# Patient Record
Sex: Female | Born: 1970 | Race: Black or African American | Hispanic: No | Marital: Single | Smoking: Current every day smoker
Health system: Southern US, Community
[De-identification: ages and names within clinical notes are randomized; demographics above are authoritative.]

## PROBLEM LIST (undated history)

## (undated) DIAGNOSIS — E079 Disorder of thyroid, unspecified: Secondary | ICD-10-CM

## (undated) DIAGNOSIS — D219 Benign neoplasm of connective and other soft tissue, unspecified: Secondary | ICD-10-CM

## (undated) DIAGNOSIS — D3502 Benign neoplasm of left adrenal gland: Secondary | ICD-10-CM

## (undated) DIAGNOSIS — M1990 Unspecified osteoarthritis, unspecified site: Secondary | ICD-10-CM

## (undated) DIAGNOSIS — K7689 Other specified diseases of liver: Secondary | ICD-10-CM

## (undated) DIAGNOSIS — F419 Anxiety disorder, unspecified: Secondary | ICD-10-CM

## (undated) DIAGNOSIS — D649 Anemia, unspecified: Secondary | ICD-10-CM

## (undated) DIAGNOSIS — R519 Headache, unspecified: Secondary | ICD-10-CM

## (undated) DIAGNOSIS — K219 Gastro-esophageal reflux disease without esophagitis: Secondary | ICD-10-CM

## (undated) DIAGNOSIS — I10 Essential (primary) hypertension: Secondary | ICD-10-CM

## (undated) HISTORY — PX: HERNIA REPAIR: SHX51

## (undated) HISTORY — DX: Other specified diseases of liver: K76.89

## (undated) HISTORY — DX: Unspecified osteoarthritis, unspecified site: M19.90

## (undated) HISTORY — PX: DILATION AND CURETTAGE OF UTERUS: SHX78

## (undated) HISTORY — DX: Benign neoplasm of connective and other soft tissue, unspecified: D21.9

## (undated) HISTORY — DX: Benign neoplasm of left adrenal gland: D35.02

## (undated) HISTORY — PX: TUBAL LIGATION: SHX77

---

## 1998-01-23 ENCOUNTER — Encounter: Admission: RE | Admit: 1998-01-23 | Discharge: 1998-01-23 | Payer: Self-pay | Admitting: Obstetrics

## 1998-01-23 ENCOUNTER — Other Ambulatory Visit: Admission: RE | Admit: 1998-01-23 | Discharge: 1998-01-23 | Payer: Self-pay | Admitting: Obstetrics

## 1999-01-21 ENCOUNTER — Emergency Department (HOSPITAL_COMMUNITY): Admission: EM | Admit: 1999-01-21 | Discharge: 1999-01-21 | Payer: Self-pay | Admitting: Emergency Medicine

## 1999-10-13 ENCOUNTER — Other Ambulatory Visit: Admission: RE | Admit: 1999-10-13 | Discharge: 1999-10-13 | Payer: Self-pay | Admitting: Obstetrics

## 1999-12-06 ENCOUNTER — Inpatient Hospital Stay (HOSPITAL_COMMUNITY): Admission: AD | Admit: 1999-12-06 | Discharge: 1999-12-06 | Payer: Self-pay | Admitting: Obstetrics

## 1999-12-28 ENCOUNTER — Inpatient Hospital Stay (HOSPITAL_COMMUNITY): Admission: AD | Admit: 1999-12-28 | Discharge: 1999-12-28 | Payer: Self-pay | Admitting: Obstetrics

## 2000-05-07 ENCOUNTER — Inpatient Hospital Stay (HOSPITAL_COMMUNITY): Admission: AD | Admit: 2000-05-07 | Discharge: 2000-05-10 | Payer: Self-pay | Admitting: Obstetrics

## 2000-05-07 ENCOUNTER — Inpatient Hospital Stay (HOSPITAL_COMMUNITY): Admission: AD | Admit: 2000-05-07 | Discharge: 2000-05-07 | Payer: Self-pay | Admitting: Obstetrics

## 2001-09-21 ENCOUNTER — Inpatient Hospital Stay (HOSPITAL_COMMUNITY): Admission: AD | Admit: 2001-09-21 | Discharge: 2001-09-21 | Payer: Self-pay | Admitting: Obstetrics

## 2001-11-06 ENCOUNTER — Inpatient Hospital Stay (HOSPITAL_COMMUNITY): Admission: AD | Admit: 2001-11-06 | Discharge: 2001-11-08 | Payer: Self-pay | Admitting: Obstetrics

## 2001-11-06 ENCOUNTER — Encounter (INDEPENDENT_AMBULATORY_CARE_PROVIDER_SITE_OTHER): Payer: Self-pay | Admitting: Specialist

## 2004-07-11 ENCOUNTER — Emergency Department (HOSPITAL_COMMUNITY): Admission: EM | Admit: 2004-07-11 | Discharge: 2004-07-11 | Payer: Self-pay | Admitting: *Deleted

## 2005-02-23 ENCOUNTER — Emergency Department (HOSPITAL_COMMUNITY): Admission: EM | Admit: 2005-02-23 | Discharge: 2005-02-23 | Payer: Self-pay | Admitting: Emergency Medicine

## 2005-02-26 ENCOUNTER — Emergency Department (HOSPITAL_COMMUNITY): Admission: EM | Admit: 2005-02-26 | Discharge: 2005-02-26 | Payer: Self-pay | Admitting: Family Medicine

## 2007-03-07 ENCOUNTER — Emergency Department (HOSPITAL_COMMUNITY): Admission: EM | Admit: 2007-03-07 | Discharge: 2007-03-07 | Payer: Self-pay | Admitting: Emergency Medicine

## 2007-11-30 ENCOUNTER — Emergency Department (HOSPITAL_COMMUNITY): Admission: EM | Admit: 2007-11-30 | Discharge: 2007-11-30 | Payer: Self-pay | Admitting: Family Medicine

## 2008-04-09 ENCOUNTER — Emergency Department (HOSPITAL_COMMUNITY): Admission: EM | Admit: 2008-04-09 | Discharge: 2008-04-09 | Payer: Self-pay | Admitting: Family Medicine

## 2008-08-23 HISTORY — PX: LAPAROSCOPIC CHOLECYSTECTOMY: SUR755

## 2008-10-31 ENCOUNTER — Emergency Department (HOSPITAL_COMMUNITY): Admission: EM | Admit: 2008-10-31 | Discharge: 2008-10-31 | Payer: Self-pay | Admitting: Emergency Medicine

## 2008-11-26 ENCOUNTER — Emergency Department (HOSPITAL_COMMUNITY): Admission: EM | Admit: 2008-11-26 | Discharge: 2008-11-26 | Payer: Self-pay | Admitting: Emergency Medicine

## 2008-12-02 ENCOUNTER — Encounter (INDEPENDENT_AMBULATORY_CARE_PROVIDER_SITE_OTHER): Payer: Self-pay | Admitting: General Surgery

## 2008-12-02 ENCOUNTER — Ambulatory Visit (HOSPITAL_COMMUNITY): Admission: RE | Admit: 2008-12-02 | Discharge: 2008-12-03 | Payer: Self-pay | Admitting: General Surgery

## 2010-07-30 ENCOUNTER — Ambulatory Visit: Payer: Self-pay | Admitting: Nurse Practitioner

## 2010-11-10 ENCOUNTER — Other Ambulatory Visit (HOSPITAL_COMMUNITY): Payer: Self-pay | Admitting: Obstetrics

## 2010-11-10 DIAGNOSIS — R52 Pain, unspecified: Secondary | ICD-10-CM

## 2010-11-10 DIAGNOSIS — Z1231 Encounter for screening mammogram for malignant neoplasm of breast: Secondary | ICD-10-CM

## 2010-11-17 ENCOUNTER — Ambulatory Visit (HOSPITAL_COMMUNITY): Admission: RE | Admit: 2010-11-17 | Payer: Medicaid Other | Source: Ambulatory Visit

## 2010-11-17 ENCOUNTER — Ambulatory Visit (HOSPITAL_COMMUNITY): Payer: Medicaid Other

## 2010-11-18 ENCOUNTER — Ambulatory Visit (HOSPITAL_COMMUNITY): Payer: Medicaid Other | Attending: Obstetrics

## 2010-11-26 ENCOUNTER — Other Ambulatory Visit (HOSPITAL_COMMUNITY): Payer: Self-pay | Admitting: Obstetrics

## 2010-11-26 DIAGNOSIS — N938 Other specified abnormal uterine and vaginal bleeding: Secondary | ICD-10-CM

## 2010-11-30 ENCOUNTER — Ambulatory Visit (HOSPITAL_COMMUNITY): Admission: RE | Admit: 2010-11-30 | Payer: Medicaid Other | Source: Ambulatory Visit

## 2010-12-02 LAB — DIFFERENTIAL
Basophils Absolute: 0.1 10*3/uL (ref 0.0–0.1)
Basophils Relative: 1 % (ref 0–1)
Eosinophils Absolute: 0.3 10*3/uL (ref 0.0–0.7)
Eosinophils Relative: 5 % (ref 0–5)
Lymphocytes Relative: 36 % (ref 12–46)
Lymphs Abs: 2 10*3/uL (ref 0.7–4.0)
Monocytes Absolute: 0.4 10*3/uL (ref 0.1–1.0)
Monocytes Relative: 7 % (ref 3–12)
Neutro Abs: 2.8 10*3/uL (ref 1.7–7.7)
Neutrophils Relative %: 50 % (ref 43–77)

## 2010-12-02 LAB — COMPREHENSIVE METABOLIC PANEL
ALT: 13 U/L (ref 0–35)
AST: 19 U/L (ref 0–37)
Albumin: 3.6 g/dL (ref 3.5–5.2)
Alkaline Phosphatase: 51 U/L (ref 39–117)
BUN: 9 mg/dL (ref 6–23)
CO2: 25 mEq/L (ref 19–32)
Calcium: 8.8 mg/dL (ref 8.4–10.5)
Chloride: 107 mEq/L (ref 96–112)
Creatinine, Ser: 0.72 mg/dL (ref 0.4–1.2)
GFR calc Af Amer: >60 mL/min (ref >60)
GFR calc non Af Amer: >60 mL/min (ref >60)
Glucose, Bld: 84 mg/dL (ref 70–99)
Potassium: 4 mEq/L (ref 3.5–5.1)
Sodium: 135 mEq/L (ref 135–145)
Total Bilirubin: 0.5 mg/dL (ref 0.3–1.2)
Total Protein: 6.7 g/dL (ref 6.0–8.3)

## 2010-12-02 LAB — PREGNANCY, URINE: Preg Test, Ur: NEGATIVE

## 2010-12-02 LAB — CBC
HCT: 32.8 % — ABNORMAL LOW (ref 36.0–46.0)
Hemoglobin: 11 g/dL — ABNORMAL LOW (ref 12.0–15.0)
MCHC: 33.6 g/dL (ref 30.0–36.0)
MCV: 87.2 fL (ref 78.0–100.0)
Platelets: 270 10*3/uL (ref 150–400)
RBC: 3.76 MIL/uL — ABNORMAL LOW (ref 3.87–5.11)
RDW: 14.6 % (ref 11.5–15.5)
WBC: 5.6 10*3/uL (ref 4.0–10.5)

## 2010-12-02 LAB — LIPASE, BLOOD: Lipase: 38 U/L (ref 11–59)

## 2010-12-03 ENCOUNTER — Ambulatory Visit (HOSPITAL_COMMUNITY)
Admission: RE | Admit: 2010-12-03 | Discharge: 2010-12-03 | Disposition: A | Discharge status: Home / Self Care | Payer: Medicaid Other | Source: Ambulatory Visit | Attending: Obstetrics | Admitting: Obstetrics

## 2010-12-03 DIAGNOSIS — N838 Other noninflammatory disorders of ovary, fallopian tube and broad ligament: Secondary | ICD-10-CM | POA: Insufficient documentation

## 2010-12-03 DIAGNOSIS — D259 Leiomyoma of uterus, unspecified: Secondary | ICD-10-CM | POA: Insufficient documentation

## 2010-12-03 DIAGNOSIS — N949 Unspecified condition associated with female genital organs and menstrual cycle: Secondary | ICD-10-CM | POA: Insufficient documentation

## 2010-12-03 DIAGNOSIS — N938 Other specified abnormal uterine and vaginal bleeding: Secondary | ICD-10-CM | POA: Insufficient documentation

## 2011-01-05 NOTE — Op Note (Signed)
Shari Smith, Shari Smith              ACCOUNT NO.:  [id number]   MEDICAL RECORD NO.:  [id number]          PATIENT TYPE:  OIB   LOCATION:  1528                         FACILITY:  [facility]   PHYSICIAN:  Anselm Pancoast. Weatherly, M.D.DATE OF BIRTH:  [birth year]   DATE OF PROCEDURE:  12/02/2008  DATE OF DISCHARGE:                               OPERATIVE REPORT   PREOPERATIVE DIAGNOSIS:  Chronic cholecystitis with stones.   POSTOPERATIVE DIAGNOSES:  Chronic cholecystitis with stones, small  umbilical hernia.   OPERATION:  Laparoscopic removal of gallbladder with cholangiogram and  repair of small umbilical hernia.   HISTORY:  Shari Smith is a 40 year old female who presented to the  emergency room I think it was last Thursday.  I was on-call as doctor of  the week and I was asked to see her.  When I arrived in the emergency  room, she said her symptoms had subsided approximately an hour half  earlier.  She had had an ultrasound that showed multiple stones within  her gallbladder and a very large gallbladder.  The patient has children.  No arrangements had been made for childcare and said she was basically  painfree at the tim, I offered her that we could add her to the OR  schedule for Monday here at [facility] and she desired to proceed with  that.  We did not repeat her laboratory studies today but used the one  from last week.  Preoperatively she is not in any significant pain at  this time.  She does have a very protuberant abdomen and the patient on  physical exam of the umbilicus you could feel a little about quarter  size defect at the fascia right at the umbilicus.  She has had a ring in  below the umbilicus and I elected to make a little transverse incision  just above the umbilicus.  The little defect was dissected down until  the actual hernia sac and I opened into this and I put two Surgilon  sutures for traction and placed Hasson cannula through the little  fascial defect.  The  patient has a very large gallbladder.  It is not  acutely inflamed and a very floppy liver.  I made the upper 10 mL trocar  in the subxiphoid area and under direct vision entered the peritoneal  cavity and then Dr. Michaell Cowing, who assisted, placed 2 lateral 5 mm trocars  fairly high so we could retract the liver upwards as she has a fairly  large intraperitoneal cavity.  The gallbladder was retracted upward and  outward.  The proximal portion of the gallbladder could be reflected  upward and outward and then you could see the peritoneum over the  proximal portion of the gallbladder and the cystic duct artery.  I  carefully opened the peritoneum and then could encompass the cystic duct  with a right-angle.  She had such a large gallbladder we were not  positive that the area that we were visualizing was definitely a cystic  duct and we were very cautious.  With the junction of the cystic duct  gallbladder definitely  being identified and I could identify the little  cystic duct lymph node, we then placed a clip across the junction of the  cystic duct with the gallbladder and then made a little opening in the  duct just proximal and a Cook catheter was introduced and held in place  with a clip.  The cholangiogram was then obtained and it shows that we  are going to about a 2 possibly a 3 cm cystic duct.  You can see the  intrahepatic and the extrahepatic bile duct and we then removed the  cholangiogram, triply clipped the cystic duct just proximal to where we  had made a little opening and then divided it.  I then dissected down  the cystic duct retracting the gallbladder laterally and could visualize  the cystic duct artery was doubly clipped proximally, singly distally  and divided and then the posterior branch of the cystic duct artery  likewise was visualized and this was doubly clipped proximally and then  divided.  We retracted the gallbladder upward and outward and it is kind  on a  mesentery and good hemostasis was obtained as we freed the  gallbladder from its liver bed.  We then placed the gallbladder in the  EndoCatch bag and then switched the camera to the upper 10 mm trocar and  grasped the bag with the mother-in-law grasper, brought it up to the  Hasson cannula and then withdrew the Hasson cannula.  I tried to pull  the gallbladder up into the actual wound but the stones within it are so  large that I could not get the stones out through the fascia defect and  I opened the fascia just slightly on the left side the transverse  incision and then we could pull the gallbladder up and then opened the  gallbladder within the bag and then using the sponge stick could  retrieve the stones so that after about 6 or 8 stones that were up to  about 2 cm in size were removed.  Then I could bring the bag containing  the gallbladder out of the peritoneal cavity.  I put a 0 Prolene suture  on the left side and then the little stitch that we had placed and upon  putting the traction suture initially was withdrawn and a good bite of  the fascia.  The CO2 was not on since it was fallen out of the little  fascial defect at this time and I put a second suture, doing a figure-of-  eight and then we reinflated the peritoneal cavity.  We could see where  we had caught the peritoneal surface of the transverse colon in this  suture.  I immediately cut the stitch and then on looking at the little  area, we could not see any type of bowel contents or anything but the  Prolene suture had definitely kind of gone through the tenia and then we  elected to put 2 figure-of-eight or Lembert sutures.  One was a figure-  of-eight, one was a simple Lembert and Dr. Michaell Cowing did this  laparoscopically since in order pull the colon up through the little  fascia incision it would have been necessary to make the incision a  little bigger.  These were both done under direct vision  laparoscopically.  No  evidence of any staining of the tissue, etc.  whether or not there were actually a little hole in the mucosa or not,  we could not tell but we think  it would be safer to put the 2  laparoscopic placed 3-0 Vicryl sutures.  Next the fascia at the  umbilicus was closed with about 3 figure-of-eight sutures of 0 Prolene  done with watching the colon with the air inflated so that there was no  evidence of catching the bowel any more and then we looked very  carefully in the little areas where the suture had been placed and there  was good closure and no evidence of any staining, the irrigating fluid  was removed.  We looked the liver bed and it was good hemostasis and  then the 5 mm port withdrawn under direct vision and the upper 10 mL  trocar was withdrawn last.  We had switched to a 5 mm scope 30 degrees  since the laparoscopic suture initially was used a stitch laterally in  through umbilical or the subxiphoid area.  This camera location gave the  easiest visualization on doing the 2 laparoscopic place stitches in  theperiteum over the tinea of the colon.  The subcutaneous wounds were  closed with a 4-0 Vicryl.  Benzoin and Steri-Strips were placed on the  skin.  We put Marcaine in the skin incisions, on the fascia incisions  and will we let the patient decide whether she wants to be released this  afternoon or spend the night and be released in the morning.      Anselm Pancoast. Zachery Dakins, M.D.  Electronically Signed     WJW/MEDQ  D:  12/02/2008  T:  12/03/2008  Job:  [id number]   cc:   [facility]

## 2011-01-08 NOTE — Discharge Summary (Signed)
[facility]  Patient:    Shari Smith, Shari Smith Visit Number: [id number] MRN: [id number]          Service Type: OBS Location: 910A 9131 01 Attending Physician:  Venita Sheffield Dictated by:   Kathreen Cosier, M.D. Admit Date:  11/06/2001 Discharge Date: 11/08/2001                             Discharge Summary  HISTORY OF PRESENT ILLNESS:   The patient is a 40 year old gravida 4, para 3/3, [facility] March 13, who is in for elective induction.  She was contracting irregularly with cervix 3-4 cm and 60%, vertex -3.  HOSPITAL COURSE:              She received Pitocin stimulation and had a normal vaginal delivery of a female, Apgars 8 and 9.  Postop she did well and desires sterilization.  On March 18 she underwent postpartum tubal ligation. She did well and was discharged home on the second postpartum day, ambulatory on a regular diet, on Tylenol No. 3 one q.4h. p.r.n. for pain.  DISCHARGE DIAGNOSIS:          Status post elective induction at term and                               postpartum tubal ligation. Dictated by:   Kathreen Cosier, M.D. Attending Physician:  Venita Sheffield DD:  11/08/01 TD:  11/09/01 Job: [id number]

## 2011-01-08 NOTE — Op Note (Signed)
[facility]  Patient:    Shari Smith, Shari Smith Visit Number: [id number] MRN: [id number]          Service Type: Attending:  Kathreen Cosier, M.D. Dictated by:   Kathreen Cosier, M.D. Proc. Date: 11/06/01                             Operative Report  PREOPERATIVE DIAGNOSES:       1. Multiparity.                               2. Proceed to postpartum tubal ligation.  ANESTHESIA:                   General.  DESCRIPTION OF PROCEDURE:     The patient placed on the operating table in a supine position, general anesthesia administered, abdomen prepped and draped, got in with a straight catheter.  Midline subumbilical incision, one inch log, was made and carried down to the fascia.  Fascia was clean and grasped with two Kochers, and the fascia and the peritoneum opened with the Mayo scissors. The left tube was grasped in the mid portion by a Babcock clamp and the tube traced to the fimbria.  A 0 plain suture placed in the mesosalpinx below the portion of tube and then was clamped.  This was tied and approximately one inch of tube transected.  Hemostasis satisfactory.  Procedure done in exact fashion on the other side.  Lap and sponge counts correct.  Abdomen closed in layers.  Peritoneum and fascia continence to avoid ______ .  Skin closed with subcuticular of 3-0 plain.  The patient tolerated the procedure well and taken to the recovery room in good condition. Dictated by:   Kathreen Cosier, M.D. Attending:  Kathreen Cosier, M.D. DD:  11/07/01 TD:  11/07/01 Job: [id number]

## 2011-01-15 ENCOUNTER — Other Ambulatory Visit: Payer: Self-pay | Admitting: Gastroenterology

## 2011-01-21 ENCOUNTER — Ambulatory Visit: Admission: RE | Admit: 2011-01-21 | Payer: Medicaid Other | Source: Ambulatory Visit

## 2011-01-28 ENCOUNTER — Other Ambulatory Visit: Payer: Medicaid Other

## 2011-01-28 ENCOUNTER — Ambulatory Visit
Admission: RE | Admit: 2011-01-28 | Discharge: 2011-01-28 | Disposition: A | Discharge status: Home / Self Care | Payer: Medicaid Other | Source: Ambulatory Visit | Attending: Gastroenterology | Admitting: Gastroenterology

## 2011-01-28 MED ORDER — IOHEXOL 300 MG/ML  SOLN
125.0000 mL | Freq: Once | INTRAMUSCULAR | Status: AC | PRN
  Administered 2011-01-28: 125 mL via INTRAVENOUS

## 2011-02-04 ENCOUNTER — Other Ambulatory Visit: Payer: Self-pay | Admitting: Gastroenterology

## 2011-02-04 DIAGNOSIS — R1084 Generalized abdominal pain: Secondary | ICD-10-CM

## 2011-02-04 DIAGNOSIS — R933 Abnormal findings on diagnostic imaging of other parts of digestive tract: Secondary | ICD-10-CM

## 2011-02-12 ENCOUNTER — Other Ambulatory Visit: Payer: Medicaid Other

## 2011-02-17 ENCOUNTER — Inpatient Hospital Stay: Admission: RE | Admit: 2011-02-17 | Payer: Medicaid Other | Source: Ambulatory Visit

## 2011-05-13 ENCOUNTER — Other Ambulatory Visit (HOSPITAL_COMMUNITY): Payer: Self-pay | Admitting: Obstetrics

## 2011-05-13 DIAGNOSIS — R935 Abnormal findings on diagnostic imaging of other abdominal regions, including retroperitoneum: Secondary | ICD-10-CM

## 2011-05-18 LAB — POCT PREGNANCY, URINE
Operator id: 247071
Preg Test, Ur: NEGATIVE

## 2011-05-18 LAB — POCT H PYLORI SCREEN: H. PYLORI SCREEN, POC: NEGATIVE

## 2011-05-21 ENCOUNTER — Ambulatory Visit (HOSPITAL_COMMUNITY)
Admission: RE | Admit: 2011-05-21 | Discharge: 2011-05-21 | Disposition: A | Discharge status: Home / Self Care | Payer: Medicaid Other | Source: Ambulatory Visit | Attending: Obstetrics | Admitting: Obstetrics

## 2011-05-21 DIAGNOSIS — R109 Unspecified abdominal pain: Secondary | ICD-10-CM | POA: Insufficient documentation

## 2011-05-21 DIAGNOSIS — R935 Abnormal findings on diagnostic imaging of other abdominal regions, including retroperitoneum: Secondary | ICD-10-CM

## 2011-05-21 DIAGNOSIS — D3500 Benign neoplasm of unspecified adrenal gland: Secondary | ICD-10-CM | POA: Insufficient documentation

## 2011-05-21 DIAGNOSIS — K7689 Other specified diseases of liver: Secondary | ICD-10-CM | POA: Insufficient documentation

## 2011-05-21 MED ORDER — GADOBENATE DIMEGLUMINE 529 MG/ML IV SOLN
20.0000 mL | Freq: Once | INTRAVENOUS | Status: AC | PRN
  Administered 2011-05-21: 20 mL via INTRAVENOUS

## 2011-06-07 LAB — POCT URINALYSIS DIP (DEVICE)
Glucose, UA: NEGATIVE
Nitrite: NEGATIVE
Operator id: 235561
Protein, ur: 30 — AB
Specific Gravity, Urine: 1.025
Urobilinogen, UA: 2 — ABNORMAL HIGH
pH: 6

## 2011-12-16 ENCOUNTER — Ambulatory Visit: Payer: Medicaid Other | Admitting: Family Medicine

## 2012-06-25 ENCOUNTER — Encounter (HOSPITAL_COMMUNITY): Payer: Self-pay | Admitting: *Deleted

## 2012-06-25 ENCOUNTER — Emergency Department (HOSPITAL_COMMUNITY)
Admission: EM | Admit: 2012-06-25 | Discharge: 2012-06-25 | Disposition: A | Discharge status: Home / Self Care | Payer: Medicaid Other | Source: Home / Self Care | Attending: Emergency Medicine | Admitting: Emergency Medicine

## 2012-06-25 ENCOUNTER — Emergency Department (INDEPENDENT_AMBULATORY_CARE_PROVIDER_SITE_OTHER): Payer: Medicaid Other

## 2012-06-25 DIAGNOSIS — IMO0002 Reserved for concepts with insufficient information to code with codable children: Secondary | ICD-10-CM

## 2012-06-25 DIAGNOSIS — S8000XA Contusion of unspecified knee, initial encounter: Secondary | ICD-10-CM

## 2012-06-25 DIAGNOSIS — T148XXA Other injury of unspecified body region, initial encounter: Secondary | ICD-10-CM

## 2012-06-25 DIAGNOSIS — S5010XA Contusion of unspecified forearm, initial encounter: Secondary | ICD-10-CM

## 2012-06-25 DIAGNOSIS — S8010XA Contusion of unspecified lower leg, initial encounter: Secondary | ICD-10-CM

## 2012-06-25 DIAGNOSIS — S52123A Displaced fracture of head of unspecified radius, initial encounter for closed fracture: Secondary | ICD-10-CM

## 2012-06-25 MED ORDER — HYDROCODONE-ACETAMINOPHEN 5-500 MG PO TABS: 1.0000 | ORAL_TABLET | Freq: Four times a day (QID) | ORAL | Status: DC | PRN

## 2012-06-25 MED ORDER — IBUPROFEN 800 MG PO TABS: 800.0000 mg | ORAL_TABLET | Freq: Three times a day (TID) | ORAL | Status: DC | PRN
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
## 2012-06-25 NOTE — ED Notes (Signed)
Ortho  Tech  On the  [employer]

## 2012-06-25 NOTE — ED Notes (Signed)
Post  Splint  Applied  By  Ortho  tech
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
## 2012-12-06 ENCOUNTER — Ambulatory Visit: Payer: Self-pay | Admitting: Nurse Practitioner

## 2013-06-10 ENCOUNTER — Emergency Department (HOSPITAL_COMMUNITY): Payer: Medicaid Other

## 2013-06-10 ENCOUNTER — Emergency Department (HOSPITAL_COMMUNITY)
Admission: EM | Admit: 2013-06-10 | Discharge: 2013-06-10 | Disposition: A | Discharge status: Home / Self Care | Payer: Medicaid Other | Attending: Emergency Medicine | Admitting: Emergency Medicine

## 2013-06-10 ENCOUNTER — Encounter (HOSPITAL_COMMUNITY): Payer: Self-pay | Admitting: Emergency Medicine

## 2013-06-10 DIAGNOSIS — Z862 Personal history of diseases of the blood and blood-forming organs and certain disorders involving the immune mechanism: Secondary | ICD-10-CM | POA: Insufficient documentation

## 2013-06-10 DIAGNOSIS — R160 Hepatomegaly, not elsewhere classified: Secondary | ICD-10-CM

## 2013-06-10 DIAGNOSIS — F17200 Nicotine dependence, unspecified, uncomplicated: Secondary | ICD-10-CM | POA: Insufficient documentation

## 2013-06-10 DIAGNOSIS — Z9089 Acquired absence of other organs: Secondary | ICD-10-CM | POA: Insufficient documentation

## 2013-06-10 DIAGNOSIS — Z3202 Encounter for pregnancy test, result negative: Secondary | ICD-10-CM | POA: Insufficient documentation

## 2013-06-10 DIAGNOSIS — Z8639 Personal history of other endocrine, nutritional and metabolic disease: Secondary | ICD-10-CM | POA: Insufficient documentation

## 2013-06-10 DIAGNOSIS — R142 Eructation: Secondary | ICD-10-CM | POA: Insufficient documentation

## 2013-06-10 DIAGNOSIS — K7689 Other specified diseases of liver: Secondary | ICD-10-CM | POA: Insufficient documentation

## 2013-06-10 DIAGNOSIS — R141 Gas pain: Secondary | ICD-10-CM | POA: Insufficient documentation

## 2013-06-10 HISTORY — DX: Disorder of thyroid, unspecified: E07.9

## 2013-06-10 LAB — COMPREHENSIVE METABOLIC PANEL
ALT: 15 U/L (ref 0–35)
AST: 18 U/L (ref 0–37)
Albumin: 3.8 g/dL (ref 3.5–5.2)
Alkaline Phosphatase: 66 U/L (ref 39–117)
BUN: 12 mg/dL (ref 6–23)
CO2: 24 mEq/L (ref 19–32)
Calcium: 8.9 mg/dL (ref 8.4–10.5)
Chloride: 102 mEq/L (ref 96–112)
Creatinine, Ser: 0.74 mg/dL (ref 0.50–1.10)
GFR calc Af Amer: >90 mL/min (ref >90)
GFR calc non Af Amer: >90 mL/min (ref >90)
Glucose, Bld: 85 mg/dL (ref 70–99)
Potassium: 3.4 mEq/L — ABNORMAL LOW (ref 3.5–5.1)
Sodium: 136 mEq/L (ref 135–145)
Total Bilirubin: 0.3 mg/dL (ref 0.3–1.2)
Total Protein: 7.8 g/dL (ref 6.0–8.3)

## 2013-06-10 LAB — CBC WITH DIFFERENTIAL/PLATELET
Basophils Absolute: 0 10*3/uL (ref 0.0–0.1)
Basophils Relative: 1 % (ref 0–1)
Eosinophils Absolute: 0.3 10*3/uL (ref 0.0–0.7)
Eosinophils Relative: 4 % (ref 0–5)
HCT: 33 % — ABNORMAL LOW (ref 36.0–46.0)
Hemoglobin: 11 g/dL — ABNORMAL LOW (ref 12.0–15.0)
Lymphocytes Relative: 44 % (ref 12–46)
Lymphs Abs: 2.7 10*3/uL (ref 0.7–4.0)
MCH: 30.6 pg (ref 26.0–34.0)
MCHC: 33.3 g/dL (ref 30.0–36.0)
MCV: 91.7 fL (ref 78.0–100.0)
Monocytes Absolute: 0.5 10*3/uL (ref 0.1–1.0)
Monocytes Relative: 8 % (ref 3–12)
Neutro Abs: 2.8 10*3/uL (ref 1.7–7.7)
Neutrophils Relative %: 44 % (ref 43–77)
Platelets: 337 10*3/uL (ref 150–400)
RBC: 3.6 MIL/uL — ABNORMAL LOW (ref 3.87–5.11)
RDW: 15.2 % (ref 11.5–15.5)
WBC: 6.3 10*3/uL (ref 4.0–10.5)

## 2013-06-10 LAB — URINE MICROSCOPIC-ADD ON

## 2013-06-10 LAB — URINALYSIS, ROUTINE W REFLEX MICROSCOPIC
Bilirubin Urine: NEGATIVE
Glucose, UA: NEGATIVE mg/dL
Ketones, ur: NEGATIVE mg/dL
Nitrite: NEGATIVE
Protein, ur: NEGATIVE mg/dL
Specific Gravity, Urine: 1.024 (ref 1.005–1.030)
Urobilinogen, UA: 1 mg/dL (ref 0.0–1.0)
pH: 5 (ref 5.0–8.0)

## 2013-06-10 LAB — PREGNANCY, URINE: Preg Test, Ur: NEGATIVE

## 2013-06-10 LAB — LIPASE, BLOOD: Lipase: 36 U/L (ref 11–59)

## 2013-06-10 MED ORDER — IOHEXOL 300 MG/ML  SOLN
100.0000 mL | Freq: Once | INTRAMUSCULAR | Status: AC | PRN
  Administered 2013-06-10: 100 mL via INTRAVENOUS

## 2013-06-10 MED ORDER — OXYCODONE-ACETAMINOPHEN 5-325 MG PO TABS
2.0000 | ORAL_TABLET | Freq: Once | ORAL | Status: AC
  Administered 2013-06-10: 2 via ORAL
  Filled 2013-06-10: qty 2

## 2013-06-10 MED ORDER — IOHEXOL 300 MG/ML  SOLN: 25.0000 mL | INTRAMUSCULAR | Status: DC | PRN

## 2013-06-10 MED ORDER — TRAMADOL HCL 50 MG PO TABS: 50.0000 mg | ORAL_TABLET | Freq: Four times a day (QID) | ORAL | Status: DC | PRN

## 2013-06-10 NOTE — ED Provider Notes (Signed)
CSN: [id number]     Arrival date & time 06/10/13  1508 History   First MD Initiated Contact with Patient 06/10/13 1533     Chief Complaint  Patient presents with  . Abdominal Pain   (Consider location/radiation/quality/duration/timing/severity/associated sxs/prior Treatment) HPI Comments: Patient presents with abdominal pain. She describes a 2 to three-month history of some intermittent pains in her abdomen. She describes as a crampy pain all over her abdomen. She states the pain started getting worse over last few days. It's not radiating. She's having normal bowel movements with no constipation. She denies any nausea vomiting or diarrhea. She denies any bloody stools or melena. She is status post cholecystectomy. She denies any urinary symptoms. She denies any past abdominal surgeries other than the cholecystectomy.  Patient is a 42 y.o. female presenting with abdominal pain.  Abdominal Pain Associated symptoms: no chest pain, no chills, no cough, no diarrhea, no fatigue, no fever, no hematuria, no nausea, no shortness of breath and no vomiting     Past Medical History  Diagnosis Date  . Thyroid disease    Past Surgical History  Procedure Laterality Date  . Cholecystectomy     History reviewed. No pertinent family history. History  Substance Use Topics  . Smoking status: Current Every Day Smoker  . Smokeless tobacco: Not on file  . Alcohol Use: Yes   OB History   Grav Para Term Preterm Abortions TAB SAB Ect Mult Living                 Review of Systems  Constitutional: Negative for fever, chills, diaphoresis and fatigue.  HENT: Negative for congestion, rhinorrhea and sneezing.   Eyes: Negative.   Respiratory: Negative for cough, chest tightness and shortness of breath.   Cardiovascular: Negative for chest pain and leg swelling.  Gastrointestinal: Positive for abdominal pain and abdominal distention. Negative for nausea, vomiting, diarrhea and blood in stool.   Genitourinary: Negative for frequency, hematuria, flank pain and difficulty urinating.  Musculoskeletal: Negative for arthralgias and back pain.  Skin: Negative for rash.  Neurological: Negative for dizziness, speech difficulty, weakness, numbness and headaches.    Allergies  Review of patient's allergies indicates no known allergies.  Home Medications   Current Outpatient Rx  Name  Route  Sig  Dispense  Refill  . acetaminophen (TYLENOL) 500 MG tablet   Oral   Take 1,000 mg by mouth every 8 (eight) hours as needed for pain.         . hydrocortisone cream 1 %   Topical   Apply 1 application topically 2 (two) times daily as needed (for eczemaa).         Marland Kitchen ibuprofen (ADVIL,MOTRIN) 200 MG tablet   Oral   Take 600 mg by mouth every 8 (eight) hours as needed for pain.         . traMADol (ULTRAM) 50 MG tablet   Oral   Take 1 tablet (50 mg total) by mouth every 6 (six) hours as needed for pain.   15 tablet   0    BP 162/91  Pulse 62  Temp(Src) 97.4 F (36.3 C) (Oral)  Resp 20  SpO2 100%  LMP 05/22/2013 Physical Exam  Constitutional: She is oriented to person, place, and time. She appears well-developed and well-nourished.  HENT:  Head: Normocephalic and atraumatic.  Eyes: Pupils are equal, round, and reactive to light.  Neck: Normal range of motion. Neck supple.  Cardiovascular: Normal rate, regular rhythm and normal heart  sounds.   Pulmonary/Chest: Effort normal and breath sounds normal. No respiratory distress. She has no wheezes. She has no rales. She exhibits no tenderness.  Abdominal: Soft. Bowel sounds are normal. There is tenderness (mild diffuse tenderness.). There is no rebound and no guarding.  No fluid wave or tympany to percussion  Musculoskeletal: Normal range of motion. She exhibits no edema.  Lymphadenopathy:    She has no cervical adenopathy.  Neurological: She is alert and oriented to person, place, and time.  Skin: Skin is warm and dry. No rash  noted.  Psychiatric: She has a normal mood and affect.    ED Course  Procedures (including critical care time) Labs Review Results for orders placed during the hospital encounter of 06/10/13  LIPASE, BLOOD      Result Value Range   Lipase 36  11 - 59 U/L  COMPREHENSIVE METABOLIC PANEL      Result Value Range   Sodium 136  135 - 145 mEq/L   Potassium 3.4 (*) 3.5 - 5.1 mEq/L   Chloride 102  96 - 112 mEq/L   CO2 24  19 - 32 mEq/L   Glucose, Bld 85  70 - 99 mg/dL   BUN 12  6 - 23 mg/dL   Creatinine, Ser 4.09  0.50 - 1.10 mg/dL   Calcium 8.9  8.4 - 81.1 mg/dL   Total Protein 7.8  6.0 - 8.3 g/dL   Albumin 3.8  3.5 - 5.2 g/dL   AST 18  0 - 37 U/L   ALT 15  0 - 35 U/L   Alkaline Phosphatase 66  39 - 117 U/L   Total Bilirubin 0.3  0.3 - 1.2 mg/dL   GFR calc non Af Amer >90  >90 mL/min   GFR calc Af Amer >90  >90 mL/min  CBC WITH DIFFERENTIAL      Result Value Range   WBC 6.3  4.0 - 10.5 K/uL   RBC 3.60 (*) 3.87 - 5.11 MIL/uL   Hemoglobin 11.0 (*) 12.0 - 15.0 g/dL   HCT 91.4 (*) 78.2 - 95.6 %   MCV 91.7  78.0 - 100.0 fL   MCH 30.6  26.0 - 34.0 pg   MCHC 33.3  30.0 - 36.0 g/dL   RDW 21.3  08.6 - 57.8 %   Platelets 337  150 - 400 K/uL   Neutrophils Relative % 44  43 - 77 %   Neutro Abs 2.8  1.7 - 7.7 K/uL   Lymphocytes Relative 44  12 - 46 %   Lymphs Abs 2.7  0.7 - 4.0 K/uL   Monocytes Relative 8  3 - 12 %   Monocytes Absolute 0.5  0.1 - 1.0 K/uL   Eosinophils Relative 4  0 - 5 %   Eosinophils Absolute 0.3  0.0 - 0.7 K/uL   Basophils Relative 1  0 - 1 %   Basophils Absolute 0.0  0.0 - 0.1 K/uL  URINALYSIS, ROUTINE W REFLEX MICROSCOPIC      Result Value Range   Color, Urine YELLOW  YELLOW   APPearance CLOUDY (*) CLEAR   Specific Gravity, Urine 1.024  1.005 - 1.030   pH 5.0  5.0 - 8.0   Glucose, UA NEGATIVE  NEGATIVE mg/dL   Hgb urine dipstick MODERATE (*) NEGATIVE   Bilirubin Urine NEGATIVE  NEGATIVE   Ketones, ur NEGATIVE  NEGATIVE mg/dL   Protein, ur NEGATIVE   NEGATIVE mg/dL   Urobilinogen, UA 1.0  0.0 - 1.0  mg/dL   Nitrite NEGATIVE  NEGATIVE   Leukocytes, UA SMALL (*) NEGATIVE  PREGNANCY, URINE      Result Value Range   Preg Test, Ur NEGATIVE  NEGATIVE  URINE MICROSCOPIC-ADD ON      Result Value Range   Squamous Epithelial / LPF MANY (*) RARE   WBC, UA 7-10  <3 WBC/hpf   RBC / HPF 3-6  <3 RBC/hpf   Bacteria, UA FEW (*) RARE   No results found.   Imaging Review Ct Abdomen Pelvis W Contrast  06/10/2013   CLINICAL DATA:  Abdominal pain  EXAM: CT ABDOMEN AND PELVIS WITH CONTRAST  TECHNIQUE: Multidetector CT imaging of the abdomen and pelvis was performed using the standard protocol following bolus administration of intravenous contrast.  CONTRAST:  OMNIPAQUE IOHEXOL 300 MG/ML  SOLN  COMPARISON:  01/28/2011  FINDINGS: Stable 1.8 cm enhancing lesion in the right lobe of the liver towards the dome. The avidly enhancing lesion adjacent to the falciform ligament in the medial segment of the left lobe measures 2.1 cm and has significantly enlarged. A lesion in the lateral segment of the left lobe on image 11 measures 2.6 cm and has also enlarged.  Post cholecystectomy.  The kidneys, spleen, pancreas, right adrenal gland are within normal limits. Stable benign left adrenal adenoma.  The uterus is lobulated likely due to fibroids. Cystic changes are present in both ovaries.  Trace free fluid in the pelvis.  Normal appendix.  Ventral abdominal hernia contains only adipose tissue.  No vertebral compression deformity. No destructive bone lesion.  IMPRESSION: Enhancing lesions in the liver have enlarged as described. Malignancy is not excluded.  Postoperative changes.  Uterine fibroids.   Electronically Signed   By: Maryclare Bean M.D.   On: 06/10/2013 18:40    EKG Interpretation   None       MDM   1. Liver mass    Patient has some enlarging liver lesions filling outpatient followup. She currently doesn't have any significant tenderness on exam of the  abdomen. She's otherwise stable. I encouraged her followup with her primary care physician to arrange for outpatient followup regarding enlarging liver lesions.    Rolan Bucco, MD 06/10/13 2007

## 2013-06-10 NOTE — ED Notes (Signed)
Presents with abdominal distention, abdominal pain and back. Pain began several months ago. Denies nausea, endorses diarrhea. Denies black tarry stools or blood in stool. Abdomen distended tender to touch. Pain is described as contractions, denies pregnancy. Pain is intermittent. Nothing makes pain better, nothing makes pain worse.

## 2013-06-10 NOTE — ED Notes (Signed)
Patient transported to CT 

## 2013-06-12 LAB — URINE CULTURE: Colony Count: >=100000

## 2013-06-14 LAB — CYTOLOGY - PAP: Pap: NEGATIVE

## 2013-06-18 ENCOUNTER — Other Ambulatory Visit (HOSPITAL_COMMUNITY): Payer: Self-pay | Admitting: Obstetrics

## 2013-06-18 DIAGNOSIS — Z1231 Encounter for screening mammogram for malignant neoplasm of breast: Secondary | ICD-10-CM

## 2013-06-29 ENCOUNTER — Ambulatory Visit (HOSPITAL_COMMUNITY): Payer: Medicaid Other

## 2013-07-06 ENCOUNTER — Ambulatory Visit (HOSPITAL_COMMUNITY): Payer: Medicaid Other | Attending: Obstetrics

## 2013-11-05 IMAGING — CT CT ABD-PELV W/ CM
2 of 5 series · 16 of 46 positions shown, 18 images · IV contrast (APPLIED)
Comparison: 01/28/2011

CLINICAL DATA: Abdominal pain

EXAM:
CT ABDOMEN AND PELVIS WITH CONTRAST
TECHNIQUE: Multidetector CT imaging of the abdomen and pelvis was performed
using the standard protocol following bolus administration of
intravenous contrast.
CONTRAST:  100mL OMNIPAQUE IOHEXOL 300 MG/ML  SOLN

[Series 2: abd/ pelvis 5.0 i30f 1 · axial · 0.66mm/px · z∈[-214,+181]mm · 13 of 89 slices shown, 15 images]
[im 5/89  soft-tissue]
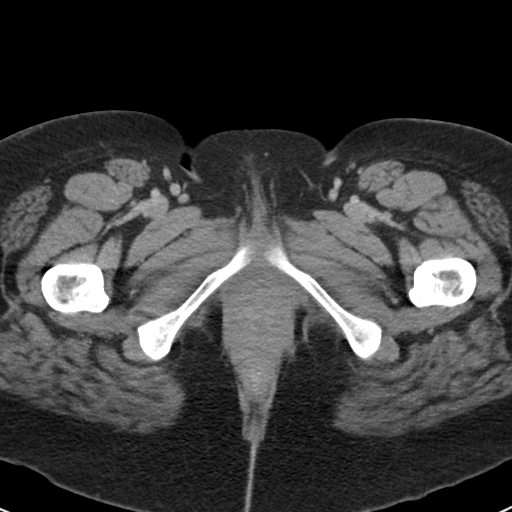
[im 5/89  bone]
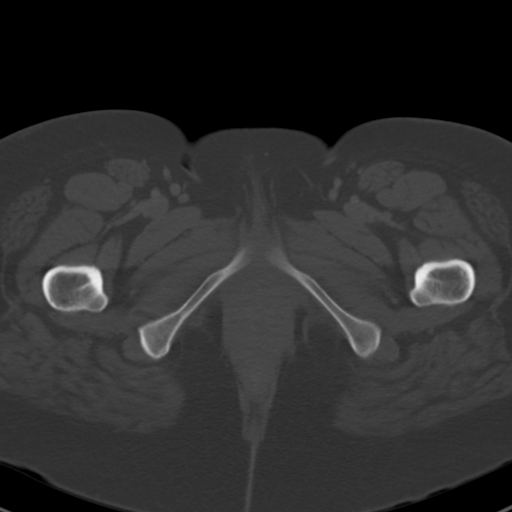
[im 14/89  soft-tissue]
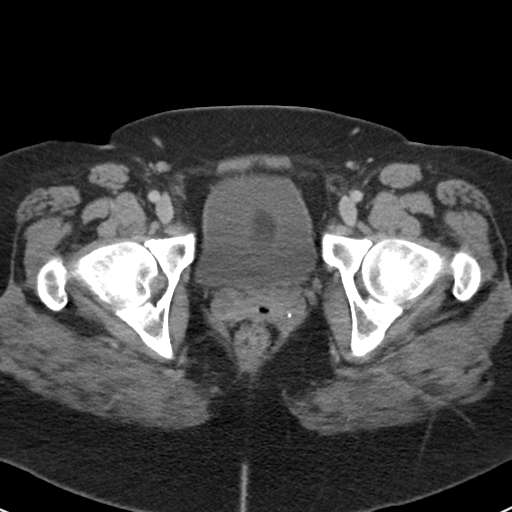
[im 19/89  soft-tissue]
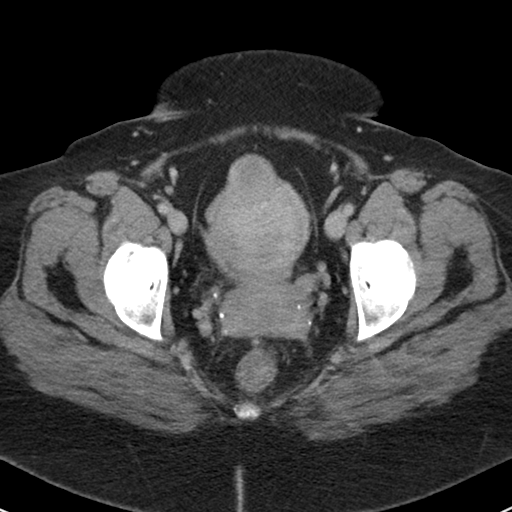
[im 24/89  soft-tissue]
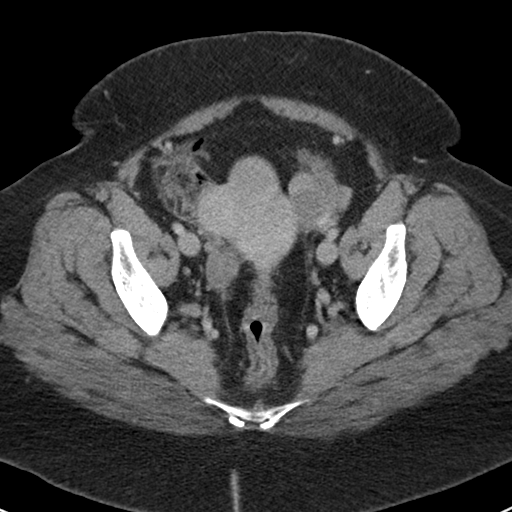
[im 33/89  soft-tissue]
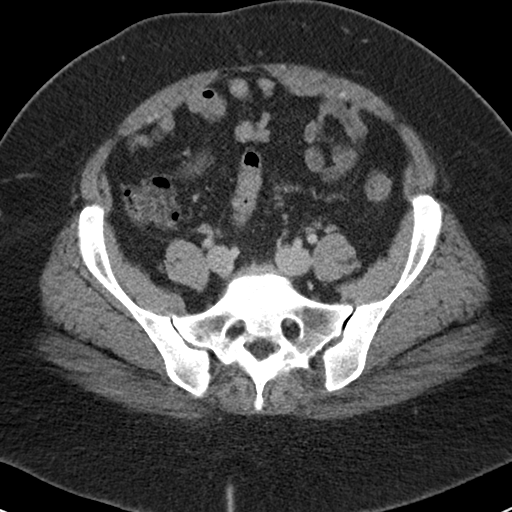
[im 38/89  soft-tissue]
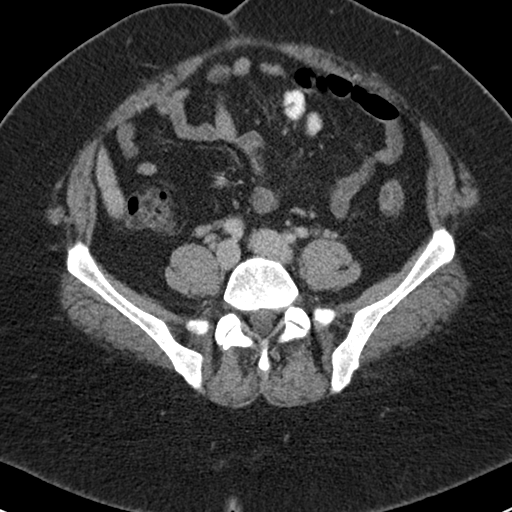
[im 47/89  soft-tissue]
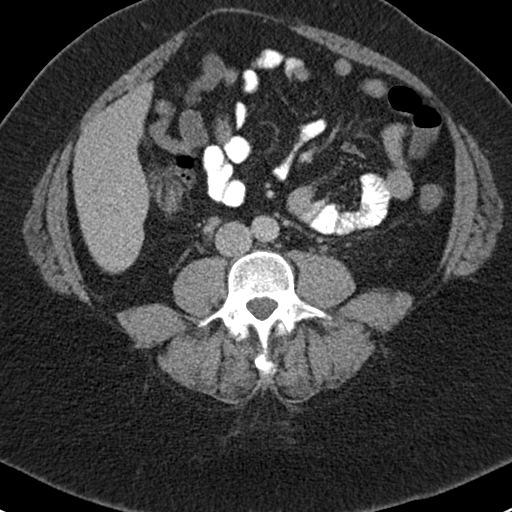
[im 51/89  soft-tissue]
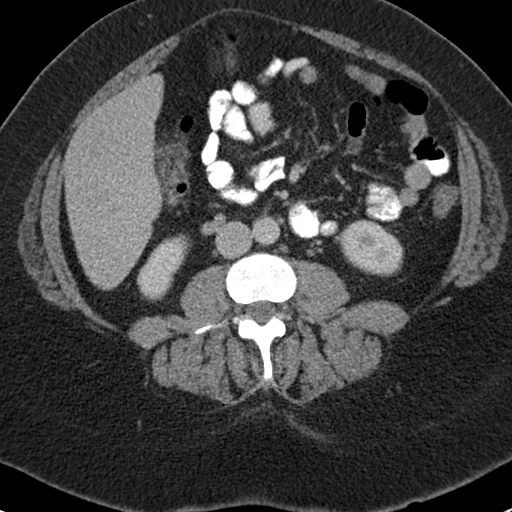
[im 56/89  soft-tissue]
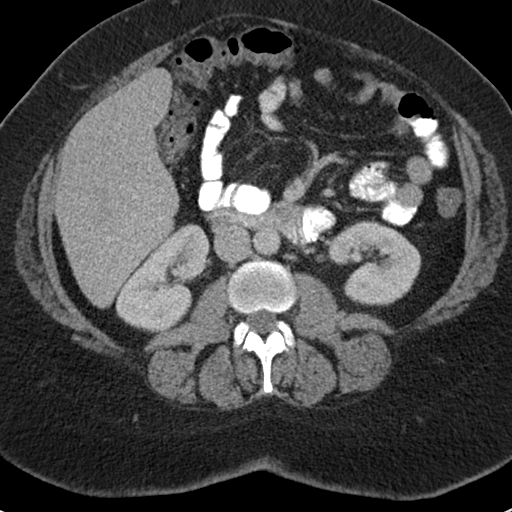
[im 56/89  bone]
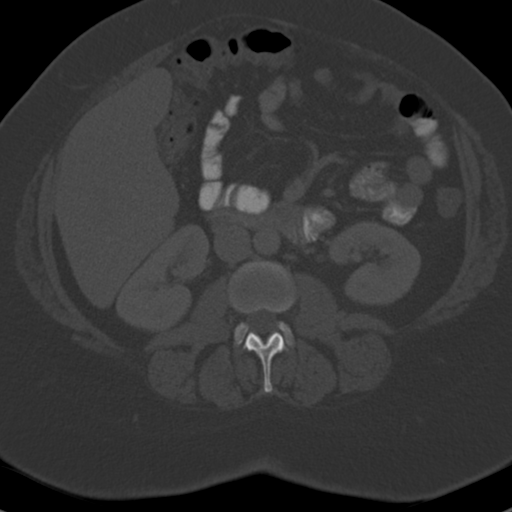
[im 65/89  soft-tissue]
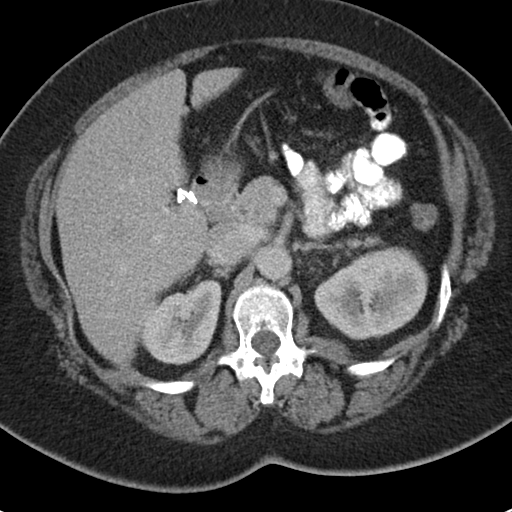
[im 70/89  soft-tissue]
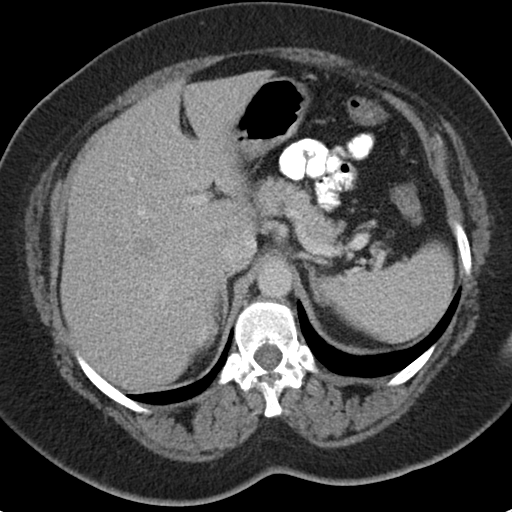
[im 75/89  soft-tissue]
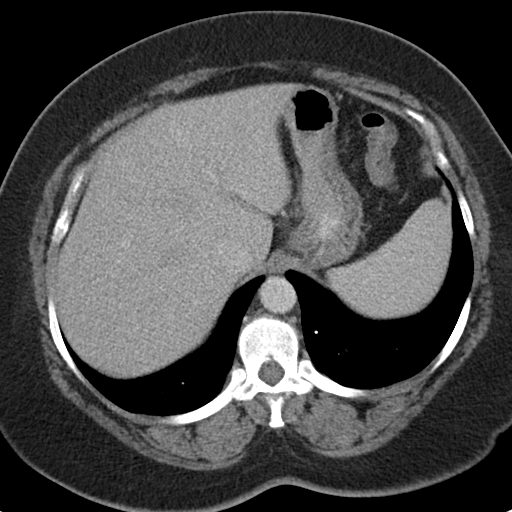
[im 84/89  soft-tissue]
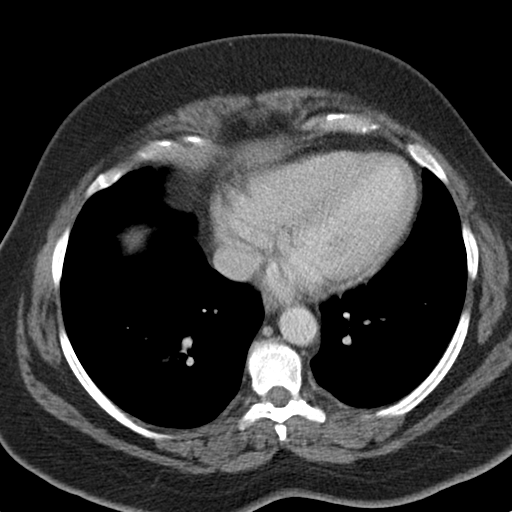

[Series 5: cor · coronal · 0.71mm/px · 3 of 165 slices shown]
[im 55/165  soft-tissue]
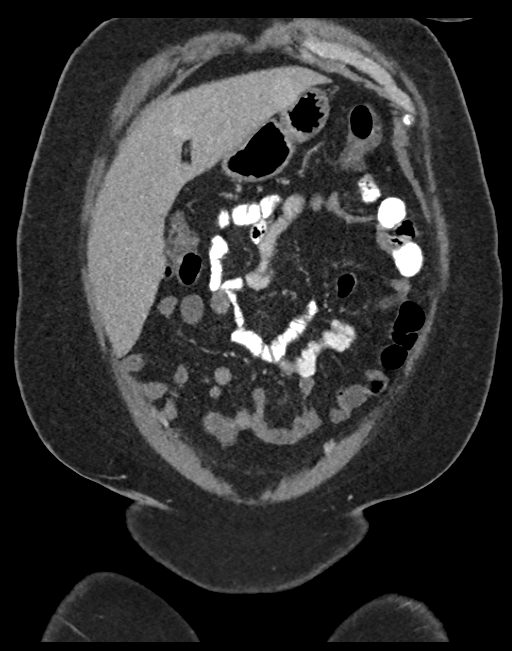
[im 73/165  soft-tissue]
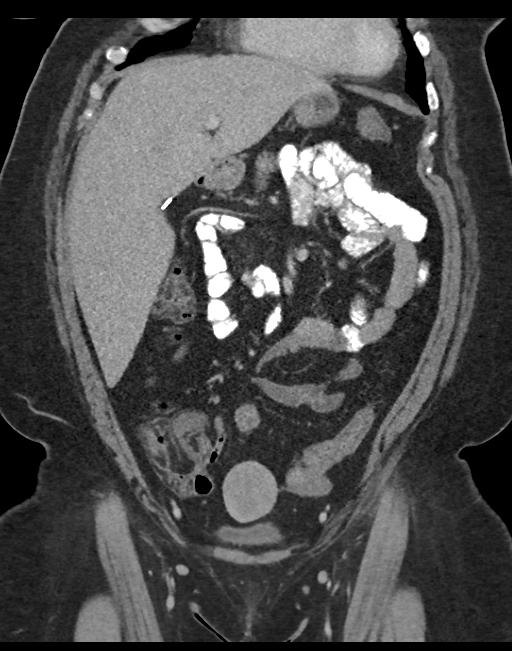
[im 92/165  soft-tissue]
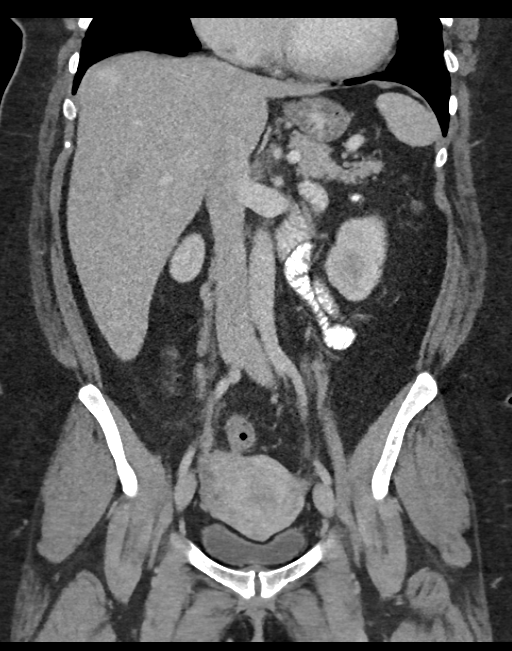

[16 of 46 positions shown; findings below may reference images not displayed]

FINDINGS: Stable 1.8 cm enhancing lesion in the right lobe of the liver
towards the dome. The avidly enhancing lesion adjacent to the
falciform ligament in the medial segment of the left lobe measures
2.1 cm and has significantly enlarged. A lesion in the lateral
segment of the left lobe on image 11 measures 2.6 cm and has also
enlarged.

Post cholecystectomy.

The kidneys, spleen, pancreas, right adrenal gland are within normal
limits. Stable benign left adrenal adenoma.

The uterus is lobulated likely due to fibroids. Cystic changes are
present in both ovaries.

Trace free fluid in the pelvis.

Normal appendix.

Ventral abdominal hernia contains only adipose tissue.

No vertebral compression deformity. No destructive bone lesion.
IMPRESSION: Enhancing lesions in the liver have enlarged as described.
Malignancy is not excluded.

Postoperative changes.

Uterine fibroids.

## 2014-04-15 ENCOUNTER — Other Ambulatory Visit: Payer: Self-pay | Admitting: Gastroenterology

## 2014-04-15 DIAGNOSIS — K769 Liver disease, unspecified: Secondary | ICD-10-CM

## 2014-04-15 DIAGNOSIS — R1033 Periumbilical pain: Secondary | ICD-10-CM

## 2014-04-17 ENCOUNTER — Emergency Department (HOSPITAL_COMMUNITY)
Admission: EM | Admit: 2014-04-17 | Discharge: 2014-04-17 | Disposition: A | Discharge status: Home / Self Care | Payer: Medicaid Other | Attending: Family Medicine | Admitting: Family Medicine

## 2014-04-17 ENCOUNTER — Encounter (HOSPITAL_COMMUNITY): Payer: Self-pay | Admitting: Emergency Medicine

## 2014-04-17 DIAGNOSIS — S99919A Unspecified injury of unspecified ankle, initial encounter: Principal | ICD-10-CM

## 2014-04-17 DIAGNOSIS — S4980XA Other specified injuries of shoulder and upper arm, unspecified arm, initial encounter: Secondary | ICD-10-CM | POA: Insufficient documentation

## 2014-04-17 DIAGNOSIS — Y92410 Unspecified street and highway as the place of occurrence of the external cause: Secondary | ICD-10-CM | POA: Insufficient documentation

## 2014-04-17 DIAGNOSIS — I10 Essential (primary) hypertension: Secondary | ICD-10-CM | POA: Insufficient documentation

## 2014-04-17 DIAGNOSIS — Z862 Personal history of diseases of the blood and blood-forming organs and certain disorders involving the immune mechanism: Secondary | ICD-10-CM | POA: Insufficient documentation

## 2014-04-17 DIAGNOSIS — Y9389 Activity, other specified: Secondary | ICD-10-CM | POA: Diagnosis not present

## 2014-04-17 DIAGNOSIS — S8990XA Unspecified injury of unspecified lower leg, initial encounter: Secondary | ICD-10-CM | POA: Diagnosis not present

## 2014-04-17 DIAGNOSIS — S99929A Unspecified injury of unspecified foot, initial encounter: Principal | ICD-10-CM

## 2014-04-17 DIAGNOSIS — S46909A Unspecified injury of unspecified muscle, fascia and tendon at shoulder and upper arm level, unspecified arm, initial encounter: Secondary | ICD-10-CM | POA: Insufficient documentation

## 2014-04-17 DIAGNOSIS — G44209 Tension-type headache, unspecified, not intractable: Secondary | ICD-10-CM | POA: Diagnosis not present

## 2014-04-17 DIAGNOSIS — F17200 Nicotine dependence, unspecified, uncomplicated: Secondary | ICD-10-CM | POA: Insufficient documentation

## 2014-04-17 DIAGNOSIS — M79601 Pain in right arm: Secondary | ICD-10-CM

## 2014-04-17 DIAGNOSIS — M25562 Pain in left knee: Secondary | ICD-10-CM

## 2014-04-17 DIAGNOSIS — Z8639 Personal history of other endocrine, nutritional and metabolic disease: Secondary | ICD-10-CM | POA: Insufficient documentation

## 2014-04-17 HISTORY — DX: Essential (primary) hypertension: I10

## 2014-04-17 MED ORDER — IBUPROFEN 800 MG PO TABS: 800.0000 mg | ORAL_TABLET | Freq: Three times a day (TID) | ORAL | Status: DC

## 2014-04-17 MED ORDER — TRAMADOL HCL 50 MG PO TABS: 50.0000 mg | ORAL_TABLET | Freq: Four times a day (QID) | ORAL | Status: DC | PRN

## 2014-04-17 NOTE — Discharge Instructions (Signed)
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
## 2014-04-22 ENCOUNTER — Inpatient Hospital Stay
Admission: RE | Admit: 2014-04-22 | Discharge: 2014-04-22 | Disposition: A | Discharge status: Home / Self Care | Payer: Medicaid Other | Source: Ambulatory Visit | Attending: Gastroenterology | Admitting: Gastroenterology

## 2014-04-23 NOTE — ED Provider Notes (Signed)
Medical screening examination/treatment/procedure(s) were performed by resident physician or non-physician practitioner and as supervising physician I was immediately available for consultation/collaboration.   Pauline Good MD.   Billy Fischer, MD 04/23/14 1226

## 2014-05-01 ENCOUNTER — Ambulatory Visit
Admission: RE | Admit: 2014-05-01 | Discharge: 2014-05-01 | Disposition: A | Discharge status: Home / Self Care | Payer: Medicaid Other | Source: Ambulatory Visit | Attending: Gastroenterology | Admitting: Gastroenterology

## 2014-05-01 DIAGNOSIS — K769 Liver disease, unspecified: Secondary | ICD-10-CM

## 2014-05-01 DIAGNOSIS — R1033 Periumbilical pain: Secondary | ICD-10-CM

## 2014-05-01 MED ORDER — GADOXETATE DISODIUM 0.25 MMOL/ML IV SOLN
10.0000 mL | Freq: Once | INTRAVENOUS | Status: AC | PRN
  Administered 2014-05-01: 10 mL via INTRAVENOUS

## 2014-06-24 ENCOUNTER — Encounter (HOSPITAL_COMMUNITY): Payer: Self-pay | Admitting: Emergency Medicine

## 2014-06-24 ENCOUNTER — Emergency Department (HOSPITAL_COMMUNITY)
Admission: EM | Admit: 2014-06-24 | Discharge: 2014-06-24 | Disposition: A | Discharge status: Home / Self Care | Payer: Medicaid Other | Attending: Emergency Medicine | Admitting: Emergency Medicine

## 2014-06-24 DIAGNOSIS — E669 Obesity, unspecified: Secondary | ICD-10-CM | POA: Insufficient documentation

## 2014-06-24 DIAGNOSIS — Z79899 Other long term (current) drug therapy: Secondary | ICD-10-CM | POA: Diagnosis not present

## 2014-06-24 DIAGNOSIS — I10 Essential (primary) hypertension: Secondary | ICD-10-CM | POA: Diagnosis not present

## 2014-06-24 DIAGNOSIS — R109 Unspecified abdominal pain: Secondary | ICD-10-CM | POA: Diagnosis present

## 2014-06-24 DIAGNOSIS — N390 Urinary tract infection, site not specified: Secondary | ICD-10-CM

## 2014-06-24 DIAGNOSIS — Z9851 Tubal ligation status: Secondary | ICD-10-CM | POA: Diagnosis not present

## 2014-06-24 DIAGNOSIS — Z3202 Encounter for pregnancy test, result negative: Secondary | ICD-10-CM | POA: Diagnosis not present

## 2014-06-24 DIAGNOSIS — Z720 Tobacco use: Secondary | ICD-10-CM | POA: Diagnosis not present

## 2014-06-24 DIAGNOSIS — R1010 Upper abdominal pain, unspecified: Secondary | ICD-10-CM

## 2014-06-24 DIAGNOSIS — Z9049 Acquired absence of other specified parts of digestive tract: Secondary | ICD-10-CM | POA: Insufficient documentation

## 2014-06-24 LAB — COMPREHENSIVE METABOLIC PANEL
ALT: 13 U/L (ref 0–35)
AST: 14 U/L (ref 0–37)
Albumin: 3.5 g/dL (ref 3.5–5.2)
Alkaline Phosphatase: 64 U/L (ref 39–117)
Anion gap: 11 (ref 5–15)
BUN: 13 mg/dL (ref 6–23)
CO2: 24 mEq/L (ref 19–32)
Calcium: 8.8 mg/dL (ref 8.4–10.5)
Chloride: 105 mEq/L (ref 96–112)
Creatinine, Ser: 0.68 mg/dL (ref 0.50–1.10)
GFR calc Af Amer: >90 mL/min (ref >90)
GFR calc non Af Amer: >90 mL/min (ref >90)
Glucose, Bld: 79 mg/dL (ref 70–99)
Potassium: 4.2 mEq/L (ref 3.7–5.3)
Sodium: 140 mEq/L (ref 137–147)
Total Bilirubin: <0.2 mg/dL — ABNORMAL LOW (ref 0.3–1.2)
Total Protein: 7.1 g/dL (ref 6.0–8.3)

## 2014-06-24 LAB — URINE MICROSCOPIC-ADD ON

## 2014-06-24 LAB — URINALYSIS, ROUTINE W REFLEX MICROSCOPIC
Bilirubin Urine: NEGATIVE
Glucose, UA: NEGATIVE mg/dL
Ketones, ur: NEGATIVE mg/dL
Nitrite: NEGATIVE
Protein, ur: 30 mg/dL — AB
Specific Gravity, Urine: 1.029 (ref 1.005–1.030)
Urobilinogen, UA: 1 mg/dL (ref 0.0–1.0)
pH: 6.5 (ref 5.0–8.0)

## 2014-06-24 LAB — CBC
HCT: 32.7 % — ABNORMAL LOW (ref 36.0–46.0)
Hemoglobin: 10.6 g/dL — ABNORMAL LOW (ref 12.0–15.0)
MCH: 29.3 pg (ref 26.0–34.0)
MCHC: 32.4 g/dL (ref 30.0–36.0)
MCV: 90.3 fL (ref 78.0–100.0)
Platelets: 328 10*3/uL (ref 150–400)
RBC: 3.62 MIL/uL — ABNORMAL LOW (ref 3.87–5.11)
RDW: 15.4 % (ref 11.5–15.5)
WBC: 5.9 10*3/uL (ref 4.0–10.5)

## 2014-06-24 LAB — POC URINE PREG, ED: Preg Test, Ur: NEGATIVE

## 2014-06-24 MED ORDER — KETOROLAC TROMETHAMINE 60 MG/2ML IM SOLN
60.0000 mg | Freq: Once | INTRAMUSCULAR | Status: AC
  Administered 2014-06-24: 60 mg via INTRAMUSCULAR
  Filled 2014-06-24: qty 2

## 2014-06-24 MED ORDER — TRAMADOL HCL 50 MG PO TABS
50.0000 mg | ORAL_TABLET | Freq: Four times a day (QID) | ORAL | Status: DC | PRN
Start: 2014-06-24 — End: 2017-09-09

## 2014-06-24 MED ORDER — CEPHALEXIN 500 MG PO CAPS: 500.0000 mg | ORAL_CAPSULE | Freq: Four times a day (QID) | ORAL | Status: DC

## 2014-06-24 NOTE — ED Provider Notes (Signed)
CSN: [id number]     Arrival date & time 06/24/14  1056 History   First MD Initiated Contact with Patient 06/24/14 1131     Chief Complaint  Patient presents with  . Abdominal Pain  . Knee Pain     (Consider location/radiation/quality/duration/timing/severity/associated sxs/prior Treatment) HPI Comments: This is a 43 year old female with a past medical history thyroid disease, hypertension and liver nodules who presents to the emergency department complaining of increasing abdominal pain 1 month. Pt reports she is under the care of GI for her liver issue and had an MRI of the liver in September 2015. Results as follows: 1. Multiple enhancing lesions in the liver have increased in size from MRI of 05/21/2011 however have imaging characteristics consistent with benign focal nodular hyperplasia. 2. Left adrenal adenoma. Pt was seen by both Dr. Benson Norway and a specialist at [facility]. The specialist at [facility] asked her to f/u with the results from the MRI ordered by Dr. Benson Norway, however pt's appointment is not until the middle of this month. The pain is the same pain she has been experiencing, radiating across her upper abdomen. No aggravating or alleviating factors. She has not tried to take anything for her pain. Denies n/v/d, constipation, CP, sob, fever or chills. She states she is concerned the MRI result said the lesions increased in size and does not know what that means for her. LMP 10/17.  Patient is a 43 y.o. female presenting with abdominal pain and knee pain. The history is provided by the patient.  Abdominal Pain Knee Pain   Past Medical History  Diagnosis Date  . Thyroid disease   . Hypertension    Past Surgical History  Procedure Laterality Date  . Cholecystectomy    . Tubal ligation     No family history on file. History  Substance Use Topics  . Smoking status: Current Every Day Smoker  . Smokeless tobacco: Not on file  . Alcohol Use: Yes     Comment: occasionally   OB History    No data available     Review of Systems  Gastrointestinal: Positive for abdominal pain.  All other systems reviewed and are negative.     Allergies  Strawberry  Home Medications   Prior to Admission medications   Medication Sig Start Date End Date Taking? Authorizing Provider  acetaminophen (TYLENOL) 500 MG tablet Take 1,000 mg by mouth every 8 (eight) hours as needed for pain.   Yes Historical Provider, MD  hydrocortisone cream 1 % Apply 1 application topically 2 (two) times daily as needed (for eczemaa).   Yes Historical Provider, MD  ibuprofen (ADVIL,MOTRIN) 200 MG tablet Take 600 mg by mouth every 8 (eight) hours as needed for pain.   Yes Historical Provider, MD  lisinopril (PRINIVIL,ZESTRIL) 20 MG tablet Take 20 mg by mouth daily.   Yes Historical Provider, MD  ibuprofen (ADVIL,MOTRIN) 800 MG tablet Take 1 tablet (800 mg total) by mouth 3 (three) times daily. 04/17/14   Liam Graham, PA-C  traMADol (ULTRAM) 50 MG tablet Take 1 tablet (50 mg total) by mouth every 6 (six) hours as needed. 06/24/14   Saba Neuman M Akon Reinoso, PA-C   BP 128/69 mmHg  Pulse 67  Temp(Src) 98.2 F (36.8 C) (Oral)  Resp 18  Wt 273 lb (123.832 kg)  SpO2 100%  LMP 06/08/2014 Physical Exam  Constitutional: She is oriented to person, place, and time. She appears well-developed and well-nourished. No distress.  Obese.  HENT:  Head: Normocephalic and atraumatic.  Mouth/Throat: Oropharynx is clear and moist.  Eyes: Conjunctivae are normal. No scleral icterus.  Neck: Normal range of motion. Neck supple.  Cardiovascular: Normal rate, regular rhythm and normal heart sounds.   Pulmonary/Chest: Effort normal and breath sounds normal.  Abdominal: Soft. Bowel sounds are normal. She exhibits no distension. There is tenderness. There is no rigidity, no rebound and no guarding.  Mild TTP RUQ, epigastric, LUQ. No peritoneal signs.  Musculoskeletal: Normal range of motion. She exhibits no edema.  Neurological: She is  alert and oriented to person, place, and time.  Skin: Skin is warm and dry. She is not diaphoretic.  Psychiatric: She has a normal mood and affect. Her behavior is normal.  Nursing note and vitals reviewed.   ED Course  Procedures (including critical care time) Labs Review Labs Reviewed  CBC - Abnormal; Notable for the following:    RBC 3.62 (*)    Hemoglobin 10.6 (*)    HCT 32.7 (*)    All other components within normal limits  COMPREHENSIVE METABOLIC PANEL - Abnormal; Notable for the following:    Total Bilirubin <0.2 (*)    All other components within normal limits  URINALYSIS, ROUTINE W REFLEX MICROSCOPIC - Abnormal; Notable for the following:    APPearance CLOUDY (*)    Hgb urine dipstick MODERATE (*)    Protein, ur 30 (*)    Leukocytes, UA SMALL (*)    All other components within normal limits  URINE MICROSCOPIC-ADD ON  POC URINE PREG, ED    Imaging Review No results found.   EKG Interpretation None      MDM   Final diagnoses:  Pain of upper abdomen   Pt with abdominal pain, known liver lesions as stated above, noted to be benign. She did not contact her GI specialist about her pain. No associated symptoms. Plan to obtain labs, give pain medication, and if normal, will d/c and advise her to f/u with GI prior to her appointment. Pt agreeable. 1:02 PM  Labs without any acute finding. Urine with 11-20 WBC, small leukocytes, rare squamous cells. She is reporting increased frequency. Will treat for UTI. She had symptom relief from toradol. Abdomen remains soft, improved from initial exam. Advised pt to f/u with both her PCP and GI specialist. Stable for d/c. Return precautions given. Patient states understanding of treatment care plan and is agreeable.  Carman Ching, PA-C 06/24/14 1317

## 2014-06-24 NOTE — Discharge Instructions (Signed)
Take tramadol as prescribed. Follow up with both your primary care physician and gastroenterology specialist. Take antibiotic as directed for 5 days.  Abdominal Pain Many things can cause abdominal pain. Usually, abdominal pain is not caused by a disease and will improve without treatment. It can often be observed and treated at home. Your health care provider will do a physical exam and possibly order blood tests and X-rays to help determine the seriousness of your pain. However, in many cases, more time must pass before a clear cause of the pain can be found. Before that point, your health care provider may not know if you need more testing or further treatment. HOME CARE INSTRUCTIONS  Monitor your abdominal pain for any changes. The following actions may help to alleviate any discomfort you are experiencing:  Only take over-the-counter or prescription medicines as directed by your health care provider.  Do not take laxatives unless directed to do so by your health care provider.  Try a clear liquid diet (broth, tea, or water) as directed by your health care provider. Slowly move to a bland diet as tolerated. SEEK MEDICAL CARE IF:  You have unexplained abdominal pain.  You have abdominal pain associated with nausea or diarrhea.  You have pain when you urinate or have a bowel movement.  You experience abdominal pain that wakes you in the night.  You have abdominal pain that is worsened or improved by eating food.  You have abdominal pain that is worsened with eating fatty foods.  You have a fever. SEEK IMMEDIATE MEDICAL CARE IF:   Your pain does not go away within 2 hours.  You keep throwing up (vomiting).  Your pain is felt only in portions of the abdomen, such as the right side or the left lower portion of the abdomen.  You pass bloody or black tarry stools. MAKE SURE YOU:  Understand these instructions.   Will watch your condition.   Will get help right away if you are  not doing well or get worse.  Document Released: 05/19/2005 Document Revised: 08/14/2013 Document Reviewed: 04/18/2013 [facility] Patient Information 2015 [place], [place]. This information is not intended to replace advice given to you by your health care provider. Make sure you discuss any questions you have with your health care provider.  Urinary Tract Infection Urinary tract infections (UTIs) can develop anywhere along your urinary tract. Your urinary tract is your body's drainage system for removing wastes and extra water. Your urinary tract includes two kidneys, two ureters, a bladder, and a urethra. Your kidneys are a pair of bean-shaped organs. Each kidney is about the size of your fist. They are located below your ribs, one on each side of your spine. CAUSES Infections are caused by microbes, which are microscopic organisms, including fungi, viruses, and bacteria. These organisms are so small that they can only be seen through a microscope. Bacteria are the microbes that most commonly cause UTIs. SYMPTOMS  Symptoms of UTIs may vary by age and gender of the patient and by the location of the infection. Symptoms in young women typically include a frequent and intense urge to urinate and a painful, burning feeling in the bladder or urethra during urination. Older women and men are more likely to be tired, shaky, and weak and have muscle aches and abdominal pain. A fever may mean the infection is in your kidneys. Other symptoms of a kidney infection include pain in your back or sides below the ribs, nausea, and vomiting. DIAGNOSIS To  diagnose a UTI, your caregiver will ask you about your symptoms. Your caregiver also will ask to provide a urine sample. The urine sample will be tested for bacteria and white blood cells. White blood cells are made by your body to help fight infection. TREATMENT  Typically, UTIs can be treated with medication. Because most UTIs are caused by a bacterial infection, they  usually can be treated with the use of antibiotics. The choice of antibiotic and length of treatment depend on your symptoms and the type of bacteria causing your infection. HOME CARE INSTRUCTIONS  If you were prescribed antibiotics, take them exactly as your caregiver instructs you. Finish the medication even if you feel better after you have only taken some of the medication.  Drink enough water and fluids to keep your urine clear or pale yellow.  Avoid caffeine, tea, and carbonated beverages. They tend to irritate your bladder.  Empty your bladder often. Avoid holding urine for long periods of time.  Empty your bladder before and after sexual intercourse.  After a bowel movement, women should cleanse from front to back. Use each tissue only once. SEEK MEDICAL CARE IF:   You have back pain.  You develop a fever.  Your symptoms do not begin to resolve within 3 days. SEEK IMMEDIATE MEDICAL CARE IF:   You have severe back pain or lower abdominal pain.  You develop chills.  You have nausea or vomiting.  You have continued burning or discomfort with urination. MAKE SURE YOU:   Understand these instructions.  Will watch your condition.  Will get help right away if you are not doing well or get worse. Document Released: 05/19/2005 Document Revised: 02/08/2012 Document Reviewed: 09/17/2011 [facility] Patient Information 2015 [place], [place]. This information is not intended to replace advice given to you by your health care provider. Make sure you discuss any questions you have with your health care provider.

## 2014-06-24 NOTE — ED Notes (Addendum)
Pt states has had abd pain increasing over past couple months-- had an MRI in September for increasing abd pain as a follow up-- states they have grown and are causing her pain at present. Periods are heavier, but not longer. Dr. Ruthann Cancer is regular OB/GYN. Denies any vaginal discharge, denies any urinary symptoms. No diarrhea, no constipation. States was sent to [facility] last year for same, unable to follow up.  Pt states was told that she has cancer of the liver-- abd tender LUQ and RUQ

## 2014-10-25 DIAGNOSIS — K7689 Other specified diseases of liver: Secondary | ICD-10-CM | POA: Insufficient documentation

## 2014-11-26 ENCOUNTER — Encounter (HOSPITAL_COMMUNITY): Payer: Self-pay

## 2014-11-26 ENCOUNTER — Emergency Department (HOSPITAL_COMMUNITY)
Admission: EM | Admit: 2014-11-26 | Discharge: 2014-11-26 | Disposition: A | Discharge status: Home / Self Care | Payer: Medicaid Other | Attending: Emergency Medicine | Admitting: Emergency Medicine

## 2014-11-26 DIAGNOSIS — M2550 Pain in unspecified joint: Secondary | ICD-10-CM

## 2014-11-26 DIAGNOSIS — Z8639 Personal history of other endocrine, nutritional and metabolic disease: Secondary | ICD-10-CM | POA: Diagnosis not present

## 2014-11-26 DIAGNOSIS — Z9089 Acquired absence of other organs: Secondary | ICD-10-CM | POA: Insufficient documentation

## 2014-11-26 DIAGNOSIS — M25561 Pain in right knee: Secondary | ICD-10-CM | POA: Diagnosis not present

## 2014-11-26 DIAGNOSIS — F419 Anxiety disorder, unspecified: Secondary | ICD-10-CM | POA: Insufficient documentation

## 2014-11-26 DIAGNOSIS — Z3202 Encounter for pregnancy test, result negative: Secondary | ICD-10-CM | POA: Diagnosis not present

## 2014-11-26 DIAGNOSIS — R109 Unspecified abdominal pain: Secondary | ICD-10-CM | POA: Insufficient documentation

## 2014-11-26 DIAGNOSIS — Z792 Long term (current) use of antibiotics: Secondary | ICD-10-CM | POA: Diagnosis not present

## 2014-11-26 DIAGNOSIS — Z79899 Other long term (current) drug therapy: Secondary | ICD-10-CM | POA: Diagnosis not present

## 2014-11-26 DIAGNOSIS — I10 Essential (primary) hypertension: Secondary | ICD-10-CM | POA: Insufficient documentation

## 2014-11-26 DIAGNOSIS — Z9851 Tubal ligation status: Secondary | ICD-10-CM | POA: Diagnosis not present

## 2014-11-26 DIAGNOSIS — Z720 Tobacco use: Secondary | ICD-10-CM | POA: Diagnosis not present

## 2014-11-26 DIAGNOSIS — M25562 Pain in left knee: Secondary | ICD-10-CM | POA: Insufficient documentation

## 2014-11-26 DIAGNOSIS — Z791 Long term (current) use of non-steroidal anti-inflammatories (NSAID): Secondary | ICD-10-CM | POA: Diagnosis not present

## 2014-11-26 DIAGNOSIS — G8929 Other chronic pain: Secondary | ICD-10-CM | POA: Diagnosis not present

## 2014-11-26 HISTORY — DX: Anxiety disorder, unspecified: F41.9

## 2014-11-26 LAB — CBC WITH DIFFERENTIAL/PLATELET
Basophils Absolute: 0 10*3/uL (ref 0.0–0.1)
Basophils Relative: 0 % (ref 0–1)
Eosinophils Absolute: 0.2 10*3/uL (ref 0.0–0.7)
Eosinophils Relative: 3 % (ref 0–5)
HCT: 33.2 % — ABNORMAL LOW (ref 36.0–46.0)
Hemoglobin: 10.7 g/dL — ABNORMAL LOW (ref 12.0–15.0)
Lymphocytes Relative: 38 % (ref 12–46)
Lymphs Abs: 2.3 10*3/uL (ref 0.7–4.0)
MCH: 28.2 pg (ref 26.0–34.0)
MCHC: 32.2 g/dL (ref 30.0–36.0)
MCV: 87.4 fL (ref 78.0–100.0)
Monocytes Absolute: 0.4 10*3/uL (ref 0.1–1.0)
Monocytes Relative: 7 % (ref 3–12)
Neutro Abs: 3 10*3/uL (ref 1.7–7.7)
Neutrophils Relative %: 52 % (ref 43–77)
Platelets: 325 10*3/uL (ref 150–400)
RBC: 3.8 MIL/uL — ABNORMAL LOW (ref 3.87–5.11)
RDW: 15.4 % (ref 11.5–15.5)
WBC: 5.9 10*3/uL (ref 4.0–10.5)

## 2014-11-26 LAB — URINALYSIS, ROUTINE W REFLEX MICROSCOPIC
Glucose, UA: NEGATIVE mg/dL
Ketones, ur: NEGATIVE mg/dL
Nitrite: NEGATIVE
Protein, ur: NEGATIVE mg/dL
Specific Gravity, Urine: 1.03 (ref 1.005–1.030)
Urobilinogen, UA: 4 mg/dL — ABNORMAL HIGH (ref 0.0–1.0)
pH: 6 (ref 5.0–8.0)

## 2014-11-26 LAB — COMPREHENSIVE METABOLIC PANEL
ALT: 34 U/L (ref 0–35)
AST: 33 U/L (ref 0–37)
Albumin: 3.5 g/dL (ref 3.5–5.2)
Alkaline Phosphatase: 66 U/L (ref 39–117)
Anion gap: 7 (ref 5–15)
BUN: 9 mg/dL (ref 6–23)
CO2: 25 mmol/L (ref 19–32)
Calcium: 8.6 mg/dL (ref 8.4–10.5)
Chloride: 102 mmol/L (ref 96–112)
Creatinine, Ser: 0.65 mg/dL (ref 0.50–1.10)
GFR calc Af Amer: >90 mL/min (ref >90)
GFR calc non Af Amer: >90 mL/min (ref >90)
Glucose, Bld: 90 mg/dL (ref 70–99)
Potassium: 3.7 mmol/L (ref 3.5–5.1)
Sodium: 134 mmol/L — ABNORMAL LOW (ref 135–145)
Total Bilirubin: 0.7 mg/dL (ref 0.3–1.2)
Total Protein: 6.4 g/dL (ref 6.0–8.3)

## 2014-11-26 LAB — URINE MICROSCOPIC-ADD ON

## 2014-11-26 LAB — LIPASE, BLOOD: Lipase: 18 U/L (ref 11–59)

## 2014-11-26 LAB — POC URINE PREG, ED: Preg Test, Ur: NEGATIVE

## 2014-11-26 MED ORDER — OMEPRAZOLE 20 MG PO CPDR: 20.0000 mg | DELAYED_RELEASE_CAPSULE | Freq: Every day | ORAL | Status: DC

## 2014-11-26 MED ORDER — OXYCODONE-ACETAMINOPHEN 5-325 MG PO TABS
2.0000 | ORAL_TABLET | Freq: Once | ORAL | Status: AC
  Administered 2014-11-26: 2 via ORAL
  Filled 2014-11-26: qty 2

## 2014-11-26 MED ORDER — HYDROCODONE-ACETAMINOPHEN 5-325 MG PO TABS: 1.0000 | ORAL_TABLET | Freq: Four times a day (QID) | ORAL | Status: DC | PRN

## 2014-11-26 NOTE — ED Notes (Signed)
Pt reports having pain in epigastric area x 1 or 2 years, went yesterday to have endoscopy but had problems with medicaid card so procedure was cancelled.  Pt also having hand, leg and knee pain x 1 or 2 years.  Pt here today "to find out what is wrong with stomach". NAD.

## 2014-11-26 NOTE — ED Provider Notes (Signed)
CSN: [id number]     Arrival date & time 11/26/14  1140 History   First MD Initiated Contact with Patient 11/26/14 1324     Chief Complaint  Patient presents with  . Abdominal Pain  . Joint Pain     (Consider location/radiation/quality/duration/timing/severity/associated sxs/prior Treatment) HPI Comments: Patient presents to the emergency department with chief complaint of chronic abdominal pain and arthritis. Patient states that she has had abdominal pain in the epigastrium for the past 1-2 years. She states that she was scheduled to have an endoscopy yesterday, but states that her insurance had been canceled and she was unable to have the procedure performed. She denies any hematemesis, melena, hematochezia, shortness of breath, or chest pain. She also reports having bilateral hand and knee pain for 1-2 years. She denies any fevers, chills, chest pain, shortness of breath, nausea, diarrhea, or constipation. She states that she did have one episode of vomiting last night.  The history is provided by the patient. No language interpreter was used.    Past Medical History  Diagnosis Date  . Thyroid disease   . Hypertension   . Anxiety    Past Surgical History  Procedure Laterality Date  . Cholecystectomy    . Tubal ligation     History reviewed. No pertinent family history. History  Substance Use Topics  . Smoking status: Current Every Day Smoker -- 0.25 packs/day    Types: Cigarettes  . Smokeless tobacco: Not on file  . Alcohol Use: Yes     Comment: occasionally   OB History    No data available     Review of Systems  Constitutional: Negative for fever and chills.  Respiratory: Negative for shortness of breath.   Cardiovascular: Negative for chest pain.  Gastrointestinal: Positive for abdominal pain. Negative for nausea, vomiting, diarrhea and constipation.  Genitourinary: Negative for dysuria.  All other systems reviewed and are negative.     Allergies   Strawberry  Home Medications   Prior to Admission medications   Medication Sig Start Date End Date Taking? Authorizing Provider  ALPRAZolam Duanne Moron) 1 MG tablet Take 1 mg by mouth 3 (three) times daily. 11/01/14  Yes Historical Provider, MD  hydrocortisone cream 1 % Apply 1 application topically 2 (two) times daily as needed (for eczemaa).   Yes Historical Provider, MD  lisinopril (PRINIVIL,ZESTRIL) 20 MG tablet Take 20 mg by mouth daily.   Yes Historical Provider, MD  naproxen (NAPROSYN) 500 MG tablet Take 500 mg by mouth 2 (two) times daily as needed. pain 10/21/14  Yes Historical Provider, MD  oxyCODONE-acetaminophen (PERCOCET) 10-325 MG per tablet Take 1 tablet by mouth every 4 (four) hours as needed for pain.   Yes Historical Provider, MD  cephALEXin (KEFLEX) 500 MG capsule Take 1 capsule (500 mg total) by mouth 4 (four) times daily. x5 days Patient not taking: Reported on 11/26/2014 06/24/14   Carman Ching, PA-C  ibuprofen (ADVIL,MOTRIN) 800 MG tablet Take 1 tablet (800 mg total) by mouth 3 (three) times daily. Patient not taking: Reported on 11/26/2014 04/17/14   Liam Graham, PA-C  traMADol (ULTRAM) 50 MG tablet Take 1 tablet (50 mg total) by mouth every 6 (six) hours as needed. Patient not taking: Reported on 11/26/2014 06/24/14   Hessie Diener Hess, PA-C   BP 138/69 mmHg  Pulse 56  Temp(Src) 98.2 F (36.8 C)  Resp 16  Ht 5\' 7"  (1.702 m)  Wt 273 lb (123.832 kg)  BMI 42.75 kg/m2  SpO2 100%  LMP 11/07/2014 Physical Exam  Constitutional: She is oriented to person, place, and time. She appears well-developed and well-nourished.  HENT:  Head: Normocephalic and atraumatic.  Eyes: Conjunctivae and EOM are normal. Pupils are equal, round, and reactive to light.  Neck: Normal range of motion. Neck supple.  Cardiovascular: Normal rate and regular rhythm.  Exam reveals no gallop and no friction rub.   No murmur heard. Pulmonary/Chest: Effort normal and breath sounds normal. No respiratory  distress. She has no wheezes. She has no rales. She exhibits no tenderness.  Abdominal: Soft. Bowel sounds are normal. She exhibits no distension and no mass. There is no tenderness. There is no rebound and no guarding.  No focal abdominal tenderness, no RLQ tenderness or pain at McBurney's point, no RUQ tenderness or Murphy's sign, no left-sided abdominal tenderness, no fluid wave, or signs of peritonitis   Musculoskeletal: Normal range of motion. She exhibits no edema or tenderness.  Neurological: She is alert and oriented to person, place, and time.  Skin: Skin is warm and dry.  Psychiatric: She has a normal mood and affect. Her behavior is normal. Judgment and thought content normal.  Nursing note and vitals reviewed.   ED Course  Procedures (including critical care time) Results for orders placed or performed during the hospital encounter of 11/26/14  CBC with Differential/Platelet  Result Value Ref Range   WBC 5.9 4.0 - 10.5 K/uL   RBC 3.80 (L) 3.87 - 5.11 MIL/uL   Hemoglobin 10.7 (L) 12.0 - 15.0 g/dL   HCT 33.2 (L) 36.0 - 46.0 %   MCV 87.4 78.0 - 100.0 fL   MCH 28.2 26.0 - 34.0 pg   MCHC 32.2 30.0 - 36.0 g/dL   RDW 15.4 11.5 - 15.5 %   Platelets 325 150 - 400 K/uL   Neutrophils Relative % 52 43 - 77 %   Neutro Abs 3.0 1.7 - 7.7 K/uL   Lymphocytes Relative 38 12 - 46 %   Lymphs Abs 2.3 0.7 - 4.0 K/uL   Monocytes Relative 7 3 - 12 %   Monocytes Absolute 0.4 0.1 - 1.0 K/uL   Eosinophils Relative 3 0 - 5 %   Eosinophils Absolute 0.2 0.0 - 0.7 K/uL   Basophils Relative 0 0 - 1 %   Basophils Absolute 0.0 0.0 - 0.1 K/uL  Comprehensive metabolic panel  Result Value Ref Range   Sodium 134 (L) 135 - 145 mmol/L   Potassium 3.7 3.5 - 5.1 mmol/L   Chloride 102 96 - 112 mmol/L   CO2 25 19 - 32 mmol/L   Glucose, Bld 90 70 - 99 mg/dL   BUN 9 6 - 23 mg/dL   Creatinine, Ser 0.65 0.50 - 1.10 mg/dL   Calcium 8.6 8.4 - 10.5 mg/dL   Total Protein 6.4 6.0 - 8.3 g/dL   Albumin 3.5 3.5 -  5.2 g/dL   AST 33 0 - 37 U/L   ALT 34 0 - 35 U/L   Alkaline Phosphatase 66 39 - 117 U/L   Total Bilirubin 0.7 0.3 - 1.2 mg/dL   GFR calc non Af Amer >90 >90 mL/min   GFR calc Af Amer >90 >90 mL/min   Anion gap 7 5 - 15  Lipase, blood  Result Value Ref Range   Lipase 18 11 - 59 U/L  Urinalysis, Routine w reflex microscopic  Result Value Ref Range   Color, Urine AMBER (A) YELLOW   APPearance CLOUDY (A) CLEAR   Specific Gravity,  Urine 1.030 1.005 - 1.030   pH 6.0 5.0 - 8.0   Glucose, UA NEGATIVE NEGATIVE mg/dL   Hgb urine dipstick SMALL (A) NEGATIVE   Bilirubin Urine SMALL (A) NEGATIVE   Ketones, ur NEGATIVE NEGATIVE mg/dL   Protein, ur NEGATIVE NEGATIVE mg/dL   Urobilinogen, UA 4.0 (H) 0.0 - 1.0 mg/dL   Nitrite NEGATIVE NEGATIVE   Leukocytes, UA SMALL (A) NEGATIVE  Urine microscopic-add on  Result Value Ref Range   Squamous Epithelial / LPF MANY (A) RARE   WBC, UA 3-6 <3 WBC/hpf   RBC / HPF 3-6 <3 RBC/hpf   Bacteria, UA FEW (A) RARE   Urine-Other TRICHOMONAS PRESENT   POC urine preg, ED (not at [facility])  Result Value Ref Range   Preg Test, Ur NEGATIVE NEGATIVE   No results found.    EKG Interpretation None      MDM   Final diagnoses:  Arthralgia  Arthralgia of both knees  Abdominal pain, unspecified abdominal location    Patient with chronic abdominal pain 1-2 years. Abdomen is soft and nontender. Very benign in presentation. Will check basic labs, and will reassess.  Upon reassessment, patient's abdomen is still soft and nontender. She is well-appearing. She denied any apparent distress. Labs are reassuring. No leukocytosis, no elevated lipase or LFTs. Given that the symptoms have been ongoing for the past 1-2 years, will recommend outpatient follow-up with gastroenterology. Patient is agreeable with this plan. Will prescribe omeprazole in the meantime.  Urinalysis is negative, negative urine pregnancy test.  Regarding the bilateral lower extremity knee pain,  there is no evidence of septic joint, no bony abnormality or deformity, patient is able to ambulate, suspect some component of osteoarthritis and will recommend primary care follow-up. Patient is stable and ready for discharge.   Montine Circle, PA-C 11/26/14 [place], DO 11/26/14 1540

## 2014-11-26 NOTE — Discharge Instructions (Signed)

## 2015-08-24 HISTORY — PX: VENTRAL HERNIA REPAIR: SHX424

## 2016-10-28 ENCOUNTER — Emergency Department (HOSPITAL_COMMUNITY): Payer: Medicaid Other

## 2016-10-28 ENCOUNTER — Emergency Department (HOSPITAL_COMMUNITY)
Admission: EM | Admit: 2016-10-28 | Discharge: 2016-10-28 | Disposition: A | Discharge status: Home / Self Care | Payer: Medicaid Other | Attending: Emergency Medicine | Admitting: Emergency Medicine

## 2016-10-28 ENCOUNTER — Encounter (HOSPITAL_COMMUNITY): Payer: Self-pay | Admitting: Emergency Medicine

## 2016-10-28 DIAGNOSIS — R1033 Periumbilical pain: Secondary | ICD-10-CM | POA: Insufficient documentation

## 2016-10-28 DIAGNOSIS — F17210 Nicotine dependence, cigarettes, uncomplicated: Secondary | ICD-10-CM | POA: Insufficient documentation

## 2016-10-28 DIAGNOSIS — R109 Unspecified abdominal pain: Secondary | ICD-10-CM | POA: Diagnosis present

## 2016-10-28 DIAGNOSIS — Z79899 Other long term (current) drug therapy: Secondary | ICD-10-CM | POA: Diagnosis not present

## 2016-10-28 DIAGNOSIS — I10 Essential (primary) hypertension: Secondary | ICD-10-CM | POA: Insufficient documentation

## 2016-10-28 LAB — I-STAT BETA HCG BLOOD, ED (MC, WL, AP ONLY): I-stat hCG, quantitative: <5 m[IU]/mL (ref <5)

## 2016-10-28 LAB — COMPREHENSIVE METABOLIC PANEL
ALT: 12 U/L — ABNORMAL LOW (ref 14–54)
AST: 15 U/L (ref 15–41)
Albumin: 3.8 g/dL (ref 3.5–5.0)
Alkaline Phosphatase: 52 U/L (ref 38–126)
Anion gap: 8 (ref 5–15)
BUN: 11 mg/dL (ref 6–20)
CO2: 23 mmol/L (ref 22–32)
Calcium: 8.7 mg/dL — ABNORMAL LOW (ref 8.9–10.3)
Chloride: 106 mmol/L (ref 101–111)
Creatinine, Ser: 0.64 mg/dL (ref 0.44–1.00)
GFR calc Af Amer: >60 mL/min (ref >60)
GFR calc non Af Amer: >60 mL/min (ref >60)
Glucose, Bld: 83 mg/dL (ref 65–99)
Potassium: 3.8 mmol/L (ref 3.5–5.1)
Sodium: 137 mmol/L (ref 135–145)
Total Bilirubin: 0.3 mg/dL (ref 0.3–1.2)
Total Protein: 6.9 g/dL (ref 6.5–8.1)

## 2016-10-28 LAB — CBC WITH DIFFERENTIAL/PLATELET
Basophils Absolute: 0 10*3/uL (ref 0.0–0.1)
Basophils Relative: 0 %
Eosinophils Absolute: 0 10*3/uL (ref 0.0–0.7)
Eosinophils Relative: 0 %
HCT: 32.4 % — ABNORMAL LOW (ref 36.0–46.0)
Hemoglobin: 10.4 g/dL — ABNORMAL LOW (ref 12.0–15.0)
Lymphocytes Relative: 39 %
Lymphs Abs: 2.3 10*3/uL (ref 0.7–4.0)
MCH: 28.7 pg (ref 26.0–34.0)
MCHC: 32.1 g/dL (ref 30.0–36.0)
MCV: 89.5 fL (ref 78.0–100.0)
Monocytes Absolute: 0.4 10*3/uL (ref 0.1–1.0)
Monocytes Relative: 7 %
Neutro Abs: 3.2 10*3/uL (ref 1.7–7.7)
Neutrophils Relative %: 54 %
Platelets: 283 10*3/uL (ref 150–400)
RBC: 3.62 MIL/uL — ABNORMAL LOW (ref 3.87–5.11)
RDW: 17.2 % — ABNORMAL HIGH (ref 11.5–15.5)
WBC: 5.9 10*3/uL (ref 4.0–10.5)

## 2016-10-28 MED ORDER — ONDANSETRON HCL 4 MG PO TABS: 4.0000 mg | ORAL_TABLET | Freq: Four times a day (QID) | ORAL | 0 refills | Status: DC

## 2016-10-28 MED ORDER — TRAMADOL HCL 50 MG PO TABS
50.0000 mg | ORAL_TABLET | Freq: Once | ORAL | Status: AC
  Administered 2016-10-28: 50 mg via ORAL
  Filled 2016-10-28: qty 1

## 2016-10-28 MED ORDER — OXYCODONE-ACETAMINOPHEN 5-325 MG PO TABS: 1.0000 | ORAL_TABLET | Freq: Four times a day (QID) | ORAL | 0 refills | Status: DC | PRN

## 2016-10-28 MED ORDER — IOPAMIDOL (ISOVUE-300) INJECTION 61%
INTRAVENOUS | Status: AC
  Administered 2016-10-28: 100 mL
  Filled 2016-10-28: qty 100

## 2016-10-28 MED ORDER — OXYCODONE-ACETAMINOPHEN 5-325 MG PO TABS
1.0000 | ORAL_TABLET | Freq: Once | ORAL | Status: AC
  Administered 2016-10-28: 1 via ORAL
  Filled 2016-10-28: qty 1

## 2016-10-28 NOTE — ED Provider Notes (Signed)
Emergency Department Provider Note   I have reviewed the triage vital signs and the nursing notes.   HISTORY  Chief Complaint Mass   HPI Shari Smith is a 46 y.o. female with PMH of anxiety and HTN presents to the emergency department for evaluation of worsening abdominal mass. Patient states that she's had this area for over a year and it seems to be getting bigger. She reports it's worse when standing up. She says that she was sent to the [facility] for evaluation of liver lesions but "they didn't do anything for me and it was a waste of time." She is continued to deal with the mass since then and today was encouraged by family to present to the emergency department because the mass is persistent. She feels is worsening slightly. She continues to have bowel movements and denies any acute onset nausea or vomiting. No radiation of pain. She describes it as dull and moderate. No alleviating factors. Not worse with food.    Past Medical History:  Diagnosis Date  . Anxiety   . Hypertension   . Thyroid disease     There are no active problems to display for this patient.   Past Surgical History:  Procedure Laterality Date  . CHOLECYSTECTOMY    . TUBAL LIGATION      Current Outpatient Rx  . Order #: [id number] Class: Historical Med  . Order #: [id number] Class: Historical Med  . Order #: [id number] Class: Historical Med  . Order #: [id number] Class: Print  . Order #: [id number] Class: Print  . Order #: [id number] Class: Print  . Order #: [id number] Class: Print  . Order #: [id number] Class: Print    Allergies Strawberry extract  History reviewed. No pertinent family history.  Social History Social History  Substance Use Topics  . Smoking status: Current Every Day Smoker    Packs/day: 0.25    Types: Cigarettes  . Smokeless tobacco: Not on file  . Alcohol use Yes     Comment: occasionally    Review of Systems  Constitutional: No fever/chills Eyes: No visual changes. ENT: No  sore throat. Cardiovascular: Denies chest pain. Respiratory: Denies shortness of breath. Gastrointestinal: Positive abdominal pain and mass.  No nausea, no vomiting.  No diarrhea.  No constipation. Genitourinary: Negative for dysuria. Musculoskeletal: Negative for back pain. Skin: Negative for rash. Neurological: Negative for headaches, focal weakness or numbness.  10-point ROS otherwise negative.  ____________________________________________   PHYSICAL EXAM:  VITAL SIGNS: ED Triage Vitals  Enc Vitals Group     BP 10/28/16 0935 138/86     Pulse --      Resp 10/28/16 0935 18     Temp 10/28/16 0935 98.3 F (36.8 C)     Temp Source 10/28/16 0935 Oral     SpO2 10/28/16 0935 100 %     Pain Score 10/28/16 0936 8   Constitutional: Alert and oriented. Well appearing and in no acute distress. Eyes: Conjunctivae are normal.  Head: Atraumatic. Nose: No congestion/rhinnorhea. Mouth/Throat: Mucous membranes are moist.   Neck: No stridor.   Cardiovascular: Normal rate, regular rhythm. Good peripheral circulation. Grossly normal heart sounds.   Respiratory: Normal respiratory effort.  No retractions. Lungs CTAB. Gastrointestinal: Soft with small periumbilical mass with is soft and easy to reduce. No overlying skin changes. No distention.  Musculoskeletal: No lower extremity tenderness nor edema. No gross deformities of extremities. Neurologic:  Normal speech and language. No gross focal neurologic deficits are appreciated.  Skin:  Skin is warm, dry and intact.  No rash noted. Psychiatric: Mood and affect are normal. Speech and behavior are normal.  ____________________________________________   LABS (all labs ordered are listed, but only abnormal results are displayed)  Labs Reviewed  CBC WITH DIFFERENTIAL/PLATELET - Abnormal; Notable for the following:       Result Value   RBC 3.62 (*)    Hemoglobin 10.4 (*)    HCT 32.4 (*)    RDW 17.2 (*)    All other components within normal  limits  COMPREHENSIVE METABOLIC PANEL - Abnormal; Notable for the following:    Calcium 8.7 (*)    ALT 12 (*)    All other components within normal limits  I-STAT BETA HCG BLOOD, ED ([facility], [facility], [facility] ONLY)   ____________________________________________  RADIOLOGY  Ct Abdomen Pelvis W Contrast  Result Date: 10/28/2016 CLINICAL DATA:  Diffuse abdominal pain EXAM: CT ABDOMEN AND PELVIS WITH CONTRAST TECHNIQUE: Multidetector CT imaging of the abdomen and pelvis was performed using the standard protocol following bolus administration of intravenous contrast. CONTRAST:  156mL ISOVUE-300 IOPAMIDOL (ISOVUE-300) INJECTION 61% COMPARISON:  06/10/2013 FINDINGS: Lower chest: Lung bases are clear. No effusions. Heart is normal size. Hepatobiliary: Prior cholecystectomy.  No focal hepatic abnormality. Pancreas: No focal abnormality or ductal dilatation. Spleen: No focal abnormality.  Normal size. Adrenals/Urinary Tract: Left adrenal nodule again noted measuring 2.2 cm, stable since prior study. Right adrenal gland unremarkable. No renal stone or hydronephrosis. No renal mass. Urinary bladder unremarkable. Stomach/Bowel: Normal appendix. Stomach, large and small bowel grossly unremarkable. Vascular/Lymphatic: No evidence of aneurysm or adenopathy. Reproductive: Enlarged uterus with multiple fibroids present. Small follicle in the right ovary. No adnexal masses. Other: No free fluid or free air. Small umbilical hernia containing fat. Musculoskeletal: No acute bony abnormality. IMPRESSION: Enlarged fibroid uterus. No acute findings in the abdomen or pelvis. Stable left adrenal nodule, compatible with adenoma. Electronically Signed   By: Rolm Baptise M.D.   On: 10/28/2016 11:11    ____________________________________________   PROCEDURES  Procedure(s) performed:   Procedures  None ____________________________________________   INITIAL IMPRESSION / ASSESSMENT AND PLAN / ED COURSE  Pertinent labs & imaging  results that were available during my care of the patient were reviewed by me and considered in my medical decision making (see chart for details).  Patient resents to the emergency department for evaluation of abdominal discomfort and worsening mass in the center of her abdomen. On my exam it seems most consistent with an periumbilical hernia. She does have visits in Epic from [place] in 2016 showing evaluation there by oncology for what is thought to be focal nodular hyperplasia of the liver. At that time her pain was not thought to be secondary to that diagnosis. She does have a primary care physician but states that she "needs to get a new one." Patient has overall very poor follow-up. No clear sign of incarceration on my exam but her body habitus limits it somewhat and she describes worsening pain over the past several weeks. Plan for CT scan of the abdomen and pelvis along with baseline labs to assess for likely hernia. Anticipate referral to general surgery as an outpatient.   3:00 PM Significant delay in lab results. Discussed with patient and apologized for delay. No significant abnormality. CT unremarkable for hernia but exam is highly suggestive. Will give small amount of pain medication for acute symptoms. Discussed return precautions and symptoms of incarcerated hernia. Will refer to PCP and General Surgery service.   At this time, I do not feel  there is any life-threatening condition present. I have reviewed and discussed all results (EKG, imaging, lab, urine as appropriate), exam findings with patient. I have reviewed nursing notes and appropriate previous records.  I feel the patient is safe to be discharged home without further emergent workup. Discussed usual and customary return precautions. Patient and family (if present) verbalize understanding and are comfortable with this plan.  Patient will follow-up with their primary care provider. If they do not have a primary care provider,  information for follow-up has been provided to them. All questions have been answered.  ____________________________________________  FINAL CLINICAL IMPRESSION(S) / ED DIAGNOSES  Final diagnoses:  Periumbilical abdominal pain     MEDICATIONS GIVEN DURING THIS VISIT:  Medications  iopamidol (ISOVUE-300) 61 % injection (100 mLs  Contrast Given 10/28/16 1056)  traMADol (ULTRAM) tablet 50 mg (50 mg Oral Given 10/28/16 1256)  oxyCODONE-acetaminophen (PERCOCET/ROXICET) 5-325 MG per tablet 1 tablet (1 tablet Oral Given 10/28/16 1438)     NEW OUTPATIENT MEDICATIONS STARTED DURING THIS VISIT:  Discharge Medication List as of 10/28/2016  3:44 PM    START taking these medications   Details  ondansetron (ZOFRAN) 4 MG tablet Take 1 tablet (4 mg total) by mouth every 6 (six) hours., Starting Thu 10/28/2016, Print    oxyCODONE-acetaminophen (PERCOCET/ROXICET) 5-325 MG tablet Take 1 tablet by mouth every 6 (six) hours as needed for severe pain., Starting Thu 10/28/2016, Print          Note:  This document was prepared using Dragon voice recognition software and may include unintentional dictation errors.  Nanda Quinton, MD Emergency Medicine   Margette Fast, MD 10/29/16 [id number]

## 2016-10-28 NOTE — Discharge Instructions (Signed)
You have been seen in the Emergency Department (ED) for abdominal pain.  Your evaluation did not identify a clear cause of your symptoms but you exam seems consistent with a hernia. You will need to follow with your PCP and the general surgery team regarding this mass.   Please follow up as instructed above regarding today?s emergent visit and the symptoms that are bothering you.  Return to the ED if your abdominal pain worsens or fails to improve, you develop bloody vomiting, bloody diarrhea, you are unable to tolerate fluids due to vomiting, fever greater than 101, or other symptoms that concern you.

## 2016-10-28 NOTE — ED Notes (Signed)
ED Provider at bedside. 

## 2016-10-28 NOTE — ED Triage Notes (Addendum)
Pt has protruding area directly above umbilical area. Painful. States getting bigger but been there for a year. She states it feels like it puts pressure on upper abdomen and chest area. PT states she was told she had stomach cancer and was seen at [facility] who recommended she return at later date because it was very small.

## 2016-11-09 DIAGNOSIS — K439 Ventral hernia without obstruction or gangrene: Secondary | ICD-10-CM | POA: Diagnosis not present

## 2017-03-30 DIAGNOSIS — M7711 Lateral epicondylitis, right elbow: Secondary | ICD-10-CM | POA: Diagnosis not present

## 2017-03-30 DIAGNOSIS — S83242A Other tear of medial meniscus, current injury, left knee, initial encounter: Secondary | ICD-10-CM | POA: Diagnosis not present

## 2017-04-27 DIAGNOSIS — M25562 Pain in left knee: Secondary | ICD-10-CM | POA: Diagnosis not present

## 2017-09-09 ENCOUNTER — Encounter (HOSPITAL_COMMUNITY): Payer: Self-pay | Admitting: Family Medicine

## 2017-09-09 ENCOUNTER — Ambulatory Visit (HOSPITAL_COMMUNITY)
Admission: EM | Admit: 2017-09-09 | Discharge: 2017-09-09 | Disposition: A | Discharge status: Home / Self Care | Payer: Medicaid Other | Attending: Physician Assistant | Admitting: Physician Assistant

## 2017-09-09 DIAGNOSIS — H6121 Impacted cerumen, right ear: Secondary | ICD-10-CM | POA: Diagnosis not present

## 2017-09-09 NOTE — ED Triage Notes (Signed)
Pt here for 2 weeks of right ear pain and trouble hearing out of that ear.

## 2017-09-09 NOTE — Discharge Instructions (Signed)
I am glad we could find a cause of your ear pain today.  You can prevent this from happening again in the future by simply placing a few drops of hydrogen peroxide in your ears for the next few days and then start applying the hydrogen peroxide into both ears once every couple of weeks.

## 2017-09-09 NOTE — ED Provider Notes (Signed)
09/09/2017 6:57 PM   DOB: 07/28/1971 / MRN: [id number]  SUBJECTIVE:  Shari Smith is a 47 y.o. female presenting for right-sided ear pain that has been getting worse over the last 2 weeks.  She notes some mild decrease in hearing on the right side relative to the left.  She denies fever, sore throat, facial pain, eye pain.  She denies any recent viral URI.  She is allergic to strawberry extract.   She  has a past medical history of Anxiety, Hypertension, and Thyroid disease.    She  reports that she has been smoking cigarettes.  She has been smoking about 0.25 packs per day. She does not have any smokeless tobacco history on file. She reports that she drinks alcohol. She reports that she does not use drugs. She  has no sexual activity history on file. The patient  has a past surgical history that includes Cholecystectomy and Tubal ligation.  Her family history is not on file.  Review of Systems  Constitutional: Negative for chills and fever.  Skin: Negative for itching and rash.  Neurological: Negative for dizziness and headaches.    OBJECTIVE:  BP (!) 145/70 (BP Location: Left Arm)   Pulse 65   Temp 98.5 F (36.9 C) (Oral)   Resp 18   LMP 09/02/2017   SpO2 100%   Physical Exam  Constitutional: She is active.  Non-toxic appearance.  HENT:  Head:    Cardiovascular: Normal rate.  Pulmonary/Chest: Effort normal. No tachypnea.  Neurological: She is alert.  Skin: Skin is warm and dry. She is not diaphoretic. No pallor.    No results found for this or any previous visit (from the past 72 hour(s)).  No results found.  ASSESSMENT AND PLAN:  No orders of the defined types were placed in this encounter.    Impacted cerumen of right ear: Lavaged here with complete resolution of symptoms.  Advised that she place a few drops of hydrogen peroxide in her ears for the next couple of days, tends to start doing that every week or so for the next 2 or 3 months.      The  patient is advised to call or return to clinic if she does not see an improvement in symptoms, or to seek the care of the closest emergency department if she worsens with the above plan.   Philis Fendt, MHS, PA-C 09/09/2017 6:57 PM    Tereasa Coop, PA-C 09/09/17 1857

## 2018-01-18 ENCOUNTER — Ambulatory Visit (HOSPITAL_COMMUNITY)
Admission: EM | Admit: 2018-01-18 | Discharge: 2018-01-18 | Disposition: A | Discharge status: Home / Self Care | Payer: Medicaid Other | Attending: Family Medicine | Admitting: Family Medicine

## 2018-01-18 ENCOUNTER — Encounter (HOSPITAL_COMMUNITY): Payer: Self-pay | Admitting: Emergency Medicine

## 2018-01-18 ENCOUNTER — Other Ambulatory Visit: Payer: Self-pay

## 2018-01-18 DIAGNOSIS — R600 Localized edema: Secondary | ICD-10-CM

## 2018-01-18 MED ORDER — MELOXICAM 7.5 MG PO TABS: 7.5000 mg | ORAL_TABLET | Freq: Every day | ORAL | 0 refills | Status: DC

## 2018-01-18 NOTE — ED Provider Notes (Signed)
[facility]    CSN: [id number] Arrival date & time: 01/18/18  1017     History   Chief Complaint Chief Complaint  Patient presents with  . Leg Swelling    HPI Shari Smith is a 47 y.o. female.   47 year old female comes in for continued leg swelling for the past 2-3 months. States that she was seen by her PCP, who started her on Lasix 20mg  QD, and she has been taking for the past month. States slight improvement at first, but now it is not improving. She is complaining of pain to the lower extremities and her low back. She denies erythema, increased warmth to the lower legs. Denies chest pain, shortness of breath. States some wheezing when climbing the stairs that resolves with rest. Denies orthopnea. Denies personal history of heart disease, diabetes. States family history of diabetes. Denies long hours of travel, history of blood clots, oral birth control use.   Patient states with history of liver mass that was monitored by oncology, but has not returned in quite awhile. She is trying to get referral from PCP to return for monitoring.      Past Medical History:  Diagnosis Date  . Anxiety   . Hypertension   . Thyroid disease     There are no active problems to display for this patient.   Past Surgical History:  Procedure Laterality Date  . CHOLECYSTECTOMY    . TUBAL LIGATION      OB History   None      Home Medications    Prior to Admission medications   Medication Sig Start Date End Date Taking? Authorizing Provider  furosemide (LASIX) 20 MG tablet Take 20 mg by mouth.   Yes [provider]  ALPRAZolam Duanne Moron) 1 MG tablet Take 1 mg by mouth 3 (three) times daily. 11/01/14   [provider]  esomeprazole (NEXIUM) 20 MG capsule Take 20 mg by mouth daily at 12 noon.    [provider]  lisinopril (PRINIVIL,ZESTRIL) 20 MG tablet Take 20 mg by mouth daily.    [provider]  meloxicam (MOBIC) 7.5 MG tablet Take 1  tablet (7.5 mg total) by mouth daily. 01/18/18   Tasia Catchings, Amy V, PA-C  ondansetron (ZOFRAN) 4 MG tablet Take 1 tablet (4 mg total) by mouth every 6 (six) hours. 10/28/16   Long, Wonda Olds, MD  oxyCODONE-acetaminophen (PERCOCET/ROXICET) 5-325 MG tablet Take 1 tablet by mouth every 6 (six) hours as needed for severe pain. 10/28/16   Long, Wonda Olds, MD    Family History Family History  Problem Relation Age of Onset  . Hypertension Mother   . Cancer Mother   . Hypertension Father     Social History Social History   Tobacco Use  . Smoking status: Current Every Day Smoker    Packs/day: 0.25    Types: Cigarettes  Substance Use Topics  . Alcohol use: Yes    Comment: occasionally  . Drug use: No     Allergies   Strawberry extract   Review of Systems Review of Systems  Reason unable to perform ROS: See HPI as above.     Physical Exam Triage Vital Signs ED Triage Vitals  Enc Vitals Group     BP 01/18/18 1108 112/73     Pulse Rate 01/18/18 1108 63     Resp 01/18/18 1108 20     Temp 01/18/18 1108 98.6 F (37 C)     Temp Source  01/18/18 1108 Oral     SpO2 01/18/18 1108 98 %     Weight --      Height --      Head Circumference --      Peak Flow --      Pain Score 01/18/18 1104 9     Pain Loc --      Pain Edu? --      Excl. in [place]? --    No data found.  Updated Vital Signs BP 112/73 (BP Location: Right Arm)   Pulse 63   Temp 98.6 F (37 C) (Oral)   Resp 20   LMP 12/06/2017   SpO2 98%   Physical Exam  Constitutional: She is oriented to person, place, and time. She appears well-developed and well-nourished. No distress.  HENT:  Head: Normocephalic and atraumatic.  Eyes: Pupils are equal, round, and reactive to light. Conjunctivae are normal.  Neck: Normal range of motion. Neck supple.  Cardiovascular: Normal rate, regular rhythm and normal heart sounds. Exam reveals no gallop and no friction rub.  No murmur heard. Pulmonary/Chest: Effort normal and breath sounds normal.  She has no wheezes. She has no rales.  Musculoskeletal:  Compression from band of stockings seen inferior to the knee bilaterally. No erythema, increased warmth. No hyperpigmentation/dusky skin tone. No obvious swelling or contusion. No pitting edema. Tenderness to palpation of shin while testing for pitting edema. Full ROM of knee and ankles. Strength normal and equal bilaterally. Sensation intact and equal bilaterally. Pedal pulse 2+ and equal bilaterally.  No tenderness to palpation of the spinous processes, bilateral back. Full ROM of back. Decreased active ROM of hips stating painful at the thighs. Full passive ROM of hips. Strength deferred. Negative straight leg raise.   Neurological: She is alert and oriented to person, place, and time.  Skin: Skin is warm and dry.     UC Treatments / Results  Labs (all labs ordered are listed, but only abnormal results are displayed) Labs Reviewed - No data to display  EKG None  Radiology No results found.  Procedures Procedures (including critical care time)  Medications Ordered in UC Medications - No data to display  Initial Impression / Assessment and Plan / UC Course  I have reviewed the triage vital signs and the nursing notes.  Pertinent labs & imaging results that were available during my care of the patient were reviewed by me and considered in my medical decision making (see chart for details).    Case discussed with Dr Joseph Art. No alarming signs on exam. Discussed possible ill fitting stockings that may be causing worsening of symptoms. Provided resources for better fitting compression socks. Discontinue lasix for now. Maintain low salt diet. Discussed with patient possibility of vein compression from the abdomen given size of liver mass. To follow up with oncology for further evaluation needed. Mobic to help with pain. Return precautions given. Patient expresses understanding and agrees to plan.  Case discussed with Dr  Joseph Art, who agrees to plan.  Final Clinical Impressions(s) / UC Diagnoses   Final diagnoses:  Bilateral lower extremity edema    ED Prescriptions    Medication Sig Dispense Auth. Provider   meloxicam (MOBIC) 7.5 MG tablet Take 1 tablet (7.5 mg total) by mouth daily. 15 tablet Tobin Chad, [place] 01/18/18 1254

## 2018-01-18 NOTE — Discharge Instructions (Signed)
No alarming signs on exam. Better compression stockings can be found at elastic therapy for cheaper price. This may help with the swelling. Update on liver mass would also be helpful as it can compress the vein and cause swelling as well. Mobic for pain and inflammation. Please follow up with PCP/oncologist for further evaluation and management needed. If experiencing redness, increased warmth to the calf, chest pain, shortness of breath, weakness, dizziness, go to the emergency department for further evaluation needed.

## 2018-01-18 NOTE — ED Triage Notes (Signed)
Bilateral leg swelling, low back pain.  Symptoms for 2-3 months.  Patient saw a provider and obtained stockings.  No improvement.

## 2018-02-21 DIAGNOSIS — F419 Anxiety disorder, unspecified: Secondary | ICD-10-CM | POA: Diagnosis not present

## 2018-02-21 DIAGNOSIS — G894 Chronic pain syndrome: Secondary | ICD-10-CM | POA: Diagnosis not present

## 2018-02-21 DIAGNOSIS — Z79891 Long term (current) use of opiate analgesic: Secondary | ICD-10-CM | POA: Diagnosis not present

## 2018-02-28 DIAGNOSIS — F419 Anxiety disorder, unspecified: Secondary | ICD-10-CM | POA: Diagnosis not present

## 2018-02-28 DIAGNOSIS — G894 Chronic pain syndrome: Secondary | ICD-10-CM | POA: Diagnosis not present

## 2018-03-03 DIAGNOSIS — F331 Major depressive disorder, recurrent, moderate: Secondary | ICD-10-CM | POA: Diagnosis not present

## 2018-03-08 DIAGNOSIS — Z0000 Encounter for general adult medical examination without abnormal findings: Secondary | ICD-10-CM | POA: Diagnosis not present

## 2018-03-08 DIAGNOSIS — K5900 Constipation, unspecified: Secondary | ICD-10-CM | POA: Diagnosis not present

## 2018-03-08 DIAGNOSIS — Z113 Encounter for screening for infections with a predominantly sexual mode of transmission: Secondary | ICD-10-CM | POA: Diagnosis not present

## 2018-03-08 DIAGNOSIS — Z124 Encounter for screening for malignant neoplasm of cervix: Secondary | ICD-10-CM | POA: Diagnosis not present

## 2018-03-08 DIAGNOSIS — Z1231 Encounter for screening mammogram for malignant neoplasm of breast: Secondary | ICD-10-CM | POA: Diagnosis not present

## 2018-03-10 ENCOUNTER — Encounter (HOSPITAL_COMMUNITY): Payer: Self-pay

## 2018-03-10 ENCOUNTER — Ambulatory Visit (HOSPITAL_COMMUNITY)
Admission: EM | Admit: 2018-03-10 | Discharge: 2018-03-10 | Disposition: A | Discharge status: Home / Self Care | Payer: Medicaid Other | Attending: Family Medicine | Admitting: Family Medicine

## 2018-03-10 DIAGNOSIS — M25562 Pain in left knee: Secondary | ICD-10-CM | POA: Diagnosis not present

## 2018-03-10 DIAGNOSIS — G8929 Other chronic pain: Secondary | ICD-10-CM

## 2018-03-10 DIAGNOSIS — F331 Major depressive disorder, recurrent, moderate: Secondary | ICD-10-CM | POA: Diagnosis not present

## 2018-03-10 MED ORDER — KETOROLAC TROMETHAMINE 60 MG/2ML IM SOLN
INTRAMUSCULAR | Status: AC
  Filled 2018-03-10: qty 2

## 2018-03-10 MED ORDER — KETOROLAC TROMETHAMINE 60 MG/2ML IM SOLN
60.0000 mg | Freq: Once | INTRAMUSCULAR | Status: AC
  Administered 2018-03-10: 60 mg via INTRAMUSCULAR

## 2018-03-10 MED ORDER — MELOXICAM 7.5 MG PO TABS: 7.5000 mg | ORAL_TABLET | Freq: Every day | ORAL | 0 refills | Status: DC

## 2018-03-10 NOTE — ED Triage Notes (Signed)
Pt presents with pain and inflammation in left knee. Pt also presents with abdominal pain from ongoing unknown condition.

## 2018-03-10 NOTE — Discharge Instructions (Signed)
It was nice meeting you!!  I am giving you a knee sleeve and a shot of Toradol for your chronic knee pain.  I will also prescribe you some Mobic to take daily for pain. Please [facility] for further management.

## 2018-03-10 NOTE — ED Provider Notes (Signed)
[facility]    CSN: [id number] Arrival date & time: 03/10/18  6160     History   Chief Complaint Chief Complaint  Patient presents with  . Knee Pain    left knee    HPI Shari Smith is a 47 y.o. female.   Patient is a 47 year old female with past medical history of anxiety, hypertension, thyroid disease, focal nodular hyperplasia of the liver. She presents today with chronic left knee pain.  She has been given a knee injection and used oxycodone in the past for her knee pain.  This is a constant pain that she deals with every day.  She currently does not have a primary care doctor as to manage her pain.      Past Medical History:  Diagnosis Date  . Anxiety   . Hypertension   . Thyroid disease     There are no active problems to display for this patient.   Past Surgical History:  Procedure Laterality Date  . CHOLECYSTECTOMY    . TUBAL LIGATION      OB History   None      Home Medications    Prior to Admission medications   Medication Sig Start Date End Date Taking? Authorizing Provider  ALPRAZolam Duanne Moron) 1 MG tablet Take 1 mg by mouth 3 (three) times daily. 11/01/14   [provider]  esomeprazole (NEXIUM) 20 MG capsule Take 20 mg by mouth daily at 12 noon.    [provider]  furosemide (LASIX) 20 MG tablet Take 20 mg by mouth.    [provider]  lisinopril (PRINIVIL,ZESTRIL) 20 MG tablet Take 20 mg by mouth daily.    [provider]  meloxicam (MOBIC) 7.5 MG tablet Take 1 tablet (7.5 mg total) by mouth daily. 03/10/18   Tilman Mcclaren, Tressia Miners A, NP  ondansetron (ZOFRAN) 4 MG tablet Take 1 tablet (4 mg total) by mouth every 6 (six) hours. 10/28/16   Long, Wonda Olds, MD  oxyCODONE-acetaminophen (PERCOCET/ROXICET) 5-325 MG tablet Take 1 tablet by mouth every 6 (six) hours as needed for severe pain. 10/28/16   Long, Wonda Olds, MD    Family History Family History  Problem Relation Age of Onset  . Hypertension Mother   .  Cancer Mother   . Hypertension Father     Social History Social History   Tobacco Use  . Smoking status: Current Every Day Smoker    Packs/day: 0.25    Types: Cigarettes  Substance Use Topics  . Alcohol use: Yes    Comment: occasionally  . Drug use: No     Allergies   Strawberry extract   Review of Systems Review of Systems  Constitutional: Negative for chills, fatigue and fever.  Musculoskeletal: Positive for arthralgias. Negative for joint swelling.  Skin: Negative for rash.  Neurological: Negative for weakness and numbness.  Hematological: Does not bruise/bleed easily.     Physical Exam Triage Vital Signs ED Triage Vitals [03/10/18 0904]  Enc Vitals Group     BP      Pulse      Resp      Temp      Temp src      SpO2      Weight      Height      Head Circumference      Peak Flow      Pain Score 9     Pain Loc      Pain Edu?  Excl. in [place]?    No data found.  Updated Vital Signs BP (!) 142/94 (BP Location: Left Arm)   Pulse 62   Temp 98.4 F (36.9 C) (Oral)   Resp 20   LMP 02/23/2018   SpO2 100%   Visual Acuity Right Eye Distance:   Left Eye Distance:   Bilateral Distance:    Right Eye Near:   Left Eye Near:    Bilateral Near:     Physical Exam  Constitutional: She is oriented to person, place, and time. She appears well-developed and well-nourished.  HENT:  Head: Normocephalic and atraumatic.  Cardiovascular: Normal rate and regular rhythm.  Pulmonary/Chest: Effort normal and breath sounds normal.  Musculoskeletal: Normal range of motion. She exhibits tenderness. She exhibits no edema or deformity.  Tenderness to palpation of left anterior knee. No swelling, erythema or ecchymosis.   Neurological: She is alert and oriented to person, place, and time.  Skin: Skin is warm and dry. Capillary refill takes less than 2 seconds.  Psychiatric: She has a normal mood and affect.  Nursing note and vitals reviewed.    UC Treatments /  Results  Labs (all labs ordered are listed, but only abnormal results are displayed) Labs Reviewed - No data to display  EKG None  Radiology No results found.  Procedures Procedures (including critical care time)  Medications Ordered in UC Medications  ketorolac (TORADOL) injection 60 mg (60 mg Intramuscular Given 03/10/18 0949)    Initial Impression / Assessment and Plan / UC Course  I have reviewed the triage vital signs and the nursing notes.  Pertinent labs & imaging results that were available during my care of the patient were reviewed by me and considered in my medical decision making (see chart for details).     Meloxicam given for chronic knee pain.  We will have her follow-up with community [facility] wellness to establish primary care manage her chronic pain. Final Clinical Impressions(s) / UC Diagnoses   Final diagnoses:  Chronic pain of left knee     Discharge Instructions     It was nice meeting you!!  I am giving you a knee sleeve and a shot of Toradol for your chronic knee pain.  I will also prescribe you some Mobic to take daily for pain. Please follow up with the [facility] for further management.     ED Prescriptions    Medication Sig Dispense Auth. Provider   meloxicam (MOBIC) 7.5 MG tablet Take 1 tablet (7.5 mg total) by mouth daily. 30 tablet Loura Halt A, NP     Controlled Substance Prescriptions [place] Controlled Substance Registry consulted? Not Applicable   Orvan July, NP 03/10/18 1054

## 2018-03-29 DIAGNOSIS — G894 Chronic pain syndrome: Secondary | ICD-10-CM | POA: Diagnosis not present

## 2018-03-29 DIAGNOSIS — F419 Anxiety disorder, unspecified: Secondary | ICD-10-CM | POA: Diagnosis not present

## 2018-03-30 ENCOUNTER — Ambulatory Visit: Payer: Medicaid Other | Attending: Family Medicine | Admitting: Licensed Clinical Social Worker

## 2018-03-30 ENCOUNTER — Ambulatory Visit: Payer: Medicaid Other | Attending: Family Medicine | Admitting: Family Medicine

## 2018-03-30 ENCOUNTER — Other Ambulatory Visit: Payer: Self-pay

## 2018-03-30 ENCOUNTER — Encounter: Payer: Self-pay | Admitting: Family Medicine

## 2018-03-30 VITALS — BP 121/83 | HR 67 | Temp 99.3°F | Resp 16 | Wt 274.0 lb

## 2018-03-30 DIAGNOSIS — F17210 Nicotine dependence, cigarettes, uncomplicated: Secondary | ICD-10-CM | POA: Diagnosis not present

## 2018-03-30 DIAGNOSIS — Z9049 Acquired absence of other specified parts of digestive tract: Secondary | ICD-10-CM | POA: Diagnosis not present

## 2018-03-30 DIAGNOSIS — K7689 Other specified diseases of liver: Secondary | ICD-10-CM | POA: Diagnosis not present

## 2018-03-30 DIAGNOSIS — M25562 Pain in left knee: Secondary | ICD-10-CM | POA: Insufficient documentation

## 2018-03-30 DIAGNOSIS — M5441 Lumbago with sciatica, right side: Secondary | ICD-10-CM | POA: Diagnosis present

## 2018-03-30 DIAGNOSIS — N920 Excessive and frequent menstruation with regular cycle: Secondary | ICD-10-CM | POA: Diagnosis not present

## 2018-03-30 DIAGNOSIS — G8929 Other chronic pain: Secondary | ICD-10-CM | POA: Diagnosis not present

## 2018-03-30 DIAGNOSIS — M545 Low back pain: Secondary | ICD-10-CM | POA: Diagnosis not present

## 2018-03-30 DIAGNOSIS — R8290 Unspecified abnormal findings in urine: Secondary | ICD-10-CM

## 2018-03-30 DIAGNOSIS — F329 Major depressive disorder, single episode, unspecified: Secondary | ICD-10-CM | POA: Diagnosis not present

## 2018-03-30 DIAGNOSIS — E079 Disorder of thyroid, unspecified: Secondary | ICD-10-CM | POA: Insufficient documentation

## 2018-03-30 DIAGNOSIS — Q447 Other congenital malformations of liver: Secondary | ICD-10-CM | POA: Diagnosis not present

## 2018-03-30 DIAGNOSIS — R319 Hematuria, unspecified: Secondary | ICD-10-CM | POA: Insufficient documentation

## 2018-03-30 DIAGNOSIS — D3502 Benign neoplasm of left adrenal gland: Secondary | ICD-10-CM | POA: Insufficient documentation

## 2018-03-30 DIAGNOSIS — I10 Essential (primary) hypertension: Secondary | ICD-10-CM | POA: Diagnosis not present

## 2018-03-30 DIAGNOSIS — F419 Anxiety disorder, unspecified: Secondary | ICD-10-CM

## 2018-03-30 DIAGNOSIS — Z9851 Tubal ligation status: Secondary | ICD-10-CM | POA: Diagnosis not present

## 2018-03-30 DIAGNOSIS — F32A Depression, unspecified: Secondary | ICD-10-CM

## 2018-03-30 DIAGNOSIS — F331 Major depressive disorder, recurrent, moderate: Secondary | ICD-10-CM

## 2018-03-30 HISTORY — DX: Benign neoplasm of left adrenal gland: D35.02

## 2018-03-30 LAB — POCT URINALYSIS DIP (CLINITEK)
Bilirubin, UA: NEGATIVE
Glucose, UA: NEGATIVE mg/dL
Ketones, POC UA: NEGATIVE mg/dL
Leukocytes, UA: NEGATIVE
Nitrite, UA: NEGATIVE
Spec Grav, UA: 1.02
Urobilinogen, UA: 4 U/dL — AB

## 2018-03-30 NOTE — Patient Instructions (Signed)
Joint Pain Joint pain can be caused by many things. The joint can be bruised, infected, weak from aging, or sore from exercise. The pain will probably go away if you follow your doctor's instructions for home care. If your joint pain continues, more tests may be needed to help find the cause of your condition. Follow these instructions at home: Watch your condition for any changes. Follow these instructions as told to lessen the pain that you are feeling:  Take medicines only as told by your doctor.  Rest the sore joint for as long as told by your doctor. If your doctor tells you to, raise (elevate) the painful joint above the level of your heart while you are sitting or lying down.  Do not do things that cause pain or make the pain worse.  If told, put ice on the painful area: ? Put ice in a plastic bag. ? Place a towel between your skin and the bag. ? Leave the ice on for 20 minutes, 2-3 times per day.  Wear an elastic bandage, splint, or sling as told by your doctor. Loosen the bandage or splint if your fingers or toes lose feeling (become numb) and tingle, or if they turn cold and blue.  Begin exercising or stretching the joint as told by your doctor. Ask your doctor what types of exercise are safe for you.  Keep all follow-up visits as told by your doctor. This is important.  Contact a doctor if:  Your pain gets worse and medicine does not help it.  Your joint pain does not get better in 3 days.  You have more bruising or swelling.  You have a fever.  You lose 10 pounds (4.5 kg) or more without trying. Get help right away if:  You are not able to move the joint.  Your fingers or toes become numb or they turn cold and blue. This information is not intended to replace advice given to you by your health care provider. Make sure you discuss any questions you have with your health care provider. Document Released: 07/28/2009 Document Revised: 01/15/2016 Document Reviewed:  05/21/2014 Elsevier Interactive Patient Education  2018 Elsevier Inc.  

## 2018-03-30 NOTE — Progress Notes (Signed)
Subjective:    Patient ID: Shari Smith, female    DOB: March 03, 1971, 47 y.o.   MRN: [id number]  HPI  47 yo female new to the practice who is being seen in follow-up of a recent emergency department visit on 03/10/18 due to chronic left knee pain. Per ED records she was treated with an injection of Toradol 60 mg and given a prescription for a 30 day supply of Mobic 7.5 mg. Past medical history per ED report is significant for anxiety, hypertension, thyroid disease and focal nodular hyperplasia of the liver. Patient denies any thyroid issues.       Patient reports  that her issues today include right sided lower back pain for the past 3 months that is an 8 on a 0-10 scale with 10 being the worse possible pain.  Patient also reports that the pain radiates around from the right mid to lower back to her right side.  Pain is dull and aching and tends to be worse during her periods.  Patient states that she did have her period last week but is not currently on her menstrual cycle.  Patient denies any current dysuria.  She has also had continued chronic left knee pain and has had recent falls x 5. Patient reports that in the past 3 months she has fallen in the grocery store, at home in her kitchen as well as on the sidewalk near her home.  Patient attributes her falls to her knee suddenly giving away.  Patient states that she has seen an orthopedic doctor and received an injection to her knee which did not seem to help and she was also given a knee brace which did not fit.      Patient reports that she recently had a Pap smear but was then later contacted and told that the solution for the Pap smear was out of date and she would need to return to have this repeated.  Patient states that she does not want to return to that office if possible.  Patient also with complaint of mid abdominal and some generalized abdominal pain.  Patient states that she had removal of her gallbladder in the past to see if this would  help with her abdominal pain and when she continued to have abdominal pain, she was diagnosed with abdominal hernias for which she had mesh repair and she believes that this is part of her abdominal pain.  Patient also states that she was told in the past that she had nodules in her liver.  Patient wonders if she needs new imaging studies regarding her liver issues.  Patient denies any jaundice or nausea.  Patient reports family history significant for mother having had stomach cancer and sister with breast cancer. Past Medical History:  Diagnosis Date  . 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 Anxiety   . Hypertension   . Thyroid disease    Past Surgical History:  Procedure Laterality Date  . CHOLECYSTECTOMY    . TUBAL LIGATION     Social History   Tobacco Use  . Smoking status: Current Every Day Smoker    Packs/day: 0.25    Types: Cigarettes  . Smokeless tobacco: Never Used  Substance Use Topics  . Alcohol use: Yes    Comment: occasionally  . Drug use: No   Allergies  Allergen Reactions  . Strawberry Extract Hives    Strawberry ice cream allergy   Review of Systems  Constitutional: Negative for fatigue and  unexpected weight change.  Respiratory: Positive for shortness of breath (Patient reports that her bedroom is on the second floor and she gets short of breath going up the stairs). Negative for cough and wheezing.   Cardiovascular: Positive for leg swelling (Patient reports use of a prescribed water pill to help with leg swelling). Negative for chest pain and palpitations.  Gastrointestinal: Positive for abdominal pain. Negative for constipation, diarrhea and nausea.  Genitourinary: Positive for flank pain. Negative for dysuria and frequency.  Musculoskeletal: Positive for arthralgias, back pain, gait problem and myalgias.  Neurological: Negative for dizziness and headaches.       Objective:   Physical Exam  Constitutional: She appears well-developed and  well-nourished. No distress.  Obese older female  HENT:  Mouth/Throat: Oropharynx is clear and moist.  TMs normal bilaterally  Eyes: Pupils are equal, round, and reactive to light. Conjunctivae and EOM are normal.  Neck: Normal range of motion. Neck supple. Thyromegaly present.  Cardiovascular: Normal rate and regular rhythm.  Pulmonary/Chest: Effort normal and breath sounds normal. No respiratory distress.  Abdominal: Soft. There is tenderness (Complaint of tenderness with palpation over the right upper quadrant). There is no rebound and no guarding.  Musculoskeletal: She exhibits tenderness (Complaint of lumbosacral discomfort with palpation as well as tenderness over bilateral left knee joint line).  Psychiatric:  Patient became tearful when talking about the experience of being told that she needed to have a repeat Pap smear because the solution for collection of her initial Pap smear was out of date  Nursing note and vitals reviewed. BP 121/83 (BP Location: Left Arm, Patient Position: Sitting, Cuff Size: Large)   Pulse 67   Temp 99.3 F (37.4 C) (Oral)   Resp 16   Wt 274 lb (124.3 kg)   SpO2 97%   BMI 42.91 kg/m      Assessment & Plan:  1. Right low back pain, unspecified chronicity, with sciatica presence unspecified Patient with complaint of right-sided low back pain and urinalysis was obtained which was positive for red blood cells and will be sent for urine culture.  Patient also mentioned that her back pain is worse during her menstrual cycle therefore uterine fibroids may be a consideration as a cause of her back pain.  Patient also may need further imaging to assess her back pain.  Patient's back pain on exam was not consistent with kidney stones as a cause of her pain. - POCT URINALYSIS DIP (CLINITEK)  2. Menorrhagia with regular cycle Patient with complaint of heavy menses along with back pain.  Will obtain CBC to look for anemia.  We will also review prior imaging to  look for any mention of uterine fibroids. - CBC with Differential  3. Focal nodular hyperplasia of liver Patient has had prior MRI showing focal nodular hyperplasia of the liver.  Patient reports that she has had no recent follow-up.  Patient will be referred to gastroenterology for further evaluation and treatment.  CMP will be obtained to assess liver function. - Ambulatory referral to Gastroenterology - Comprehensive metabolic panel  4. Chronic pain of left knee Patient was complaining of chronic pain to the left knee.  Patient also states that she has had recurrent falls secondary to her left knee giving away.  At today's visit, patient did not experience any gait abnormality and did not have any difficulty getting onto the exam table.  Patient will be referred back to orthopedics for further evaluation and treatment.   - Ambulatory referral  to Orthopedic Surgery  5. Abnormal urinalysis Patient with hematuria on urinalysis and urinalysis will be sent for culture.  Patient expressed that she had a urine test yesterday when she saw her pain management doctor and they did not mention any abnormalities but I discussed with her that the urine test done by pain management was likely a urine drug screen and not to assess for urine abnormalities.  Patient will be notified of the urine culture results and if any further treatment such as antibiotics as needed based on the results. - Urine Culture  6. Anxiety and depression Patient with an abnormal depression screen indicative of depressive symptoms/depression.  Medical social worker spoke with patient regarding her anxiety but I have not yet followed up with medical social worker regarding results of her discussion with the patient.  On patient's medication list, she list the use of alprazolam 1 mg 3 times daily and oxycodone.  [place] controlled substance registry was reviewed.  Patient appears to have received controlled substances including the  alprazolam and oxycodone-acetaminophen from 2 different physicians during the month of July 2019 with prescriptions written on 02/21/2018 and filled on 02/24/2018 as well as prescriptions received and filled on 03/10/2018.  Patient did not initially request any medication for pain or anxiety/depression during her visit however as she was being escorted to check out by the nurse, patient expressed the need for medication for sleep and for headache.  As patient had not mentioned issues with either sleep or headache during the actual office visit, nursing was instructed to tell patient that she could try over-the-counter melatonin to help with sleep and over-the-counter Excedrin to help with headaches and to schedule a follow-up appointment regarding these issues.  An After Visit Summary was printed and given to the patient.  Return in about 6 weeks (around 05/11/2018) for follow-up of chronic medical issues.

## 2018-03-30 NOTE — BH Specialist Note (Signed)
Integrated Behavioral Health Initial Visit  MRN: [id number] Name: Shari Smith  Number of [place] Clinician visits:: 1/6 Session Start time: 4:00 PM  Session End time: 4:30 PM Total time: 30 minutes  Type of Service: [place] Interpretor:No. Interpretor Name and Language: N/A   Warm Hand Off Completed.       SUBJECTIVE: Shari Smith is a 47 y.o. female accompanied by self Patient was referred by Dr. Chapman Fitch for depression and anxiety. Patient reports the following symptoms/concerns: feelings of sadness and worry, mood swings, difficulty sleeping, low energy, poor appetite, decreased concentration, difficulty relaxing, and irritability Duration of problem: Ongoing, Pt reports having to discontinue with previous psychiatrist last month, increasing symptoms; Severity of problem: severe  OBJECTIVE: Mood: Anxious and Affect: Appropriate Risk of harm to self or others: No plan to harm self or others  LIFE CONTEXT: Family and Social: Pt receives support from family School/Work: Pt receives food stamps and resides in public housing. She is currently looking for employment Self-Care: Pt participates in medication management to cope with anxiety. She has an upcoming appointment with [facility] 14, [facility]. Pt smokes cigarettes (1-2 a day) Life Changes: Pt has difficulty managing mental health. She recently had a self-reported nervous breakdown after being prescribed medication (Xanax) from previous psychiatrist due to concerns that pt was selling her medications. Pt denies allegations.  GOALS ADDRESSED: Patient will: 1. Reduce symptoms of: anxiety, depression and stress 2. Increase knowledge and/or ability of: coping skills and healthy habits  3. Demonstrate ability to: Increase healthy adjustment to current life circumstances and Increase adequate support systems for patient/family  INTERVENTIONS: Interventions utilized:  Mindfulness or Psychologist, educational, Psychoeducation and/or Health Education and Link to [employer]  Standardized Assessments completed: GAD-7 and PHQ 2&9  ASSESSMENT: Patient currently experiencing depression and anxitety. She reports feelings of sadness and worry, mood swings, difficulty sleeping, low energy, poor appetite, decreased concentration, difficulty relaxing, and irritability. Pt denies SI/HI/AVH and reports support from family.    Patient may benefit from psychoeducation and psychotherapy. LCSWA educated pt on the correlation between one's physical and mental health, in addition, to how stress can negatively impact both. Healthy coping skills were discussed to assist in decrease of symptoms and stress management. Pt has an upcoming appointment with [facility] for medication management on 04/05/18. Supportive resources to assist pt in obtaining employment was provided.   PLAN: 1. Follow up with behavioral health clinician on : Pt was encouraged to contact LCSWA if symptoms worsen or fail to improve to schedule behavioral appointments at [facility]. 2. Behavioral recommendations: LCSWA recommends that pt apply healthy coping skills discussed and utilize provided resources. Pt is encouraged to schedule follow up appointment with LCSWA 3. Referral(s): [place] (In Clinic) and [employer]:  Employment 4. "From scale of 1-10, how likely are you to follow plan?":   Rebekah Chesterfield, LCSW 03/31/18 4:43 PM

## 2018-03-31 ENCOUNTER — Encounter: Payer: Self-pay | Admitting: Internal Medicine

## 2018-03-31 ENCOUNTER — Telehealth: Payer: Self-pay

## 2018-03-31 DIAGNOSIS — M25511 Pain in right shoulder: Secondary | ICD-10-CM | POA: Diagnosis not present

## 2018-03-31 DIAGNOSIS — G894 Chronic pain syndrome: Secondary | ICD-10-CM | POA: Diagnosis not present

## 2018-03-31 DIAGNOSIS — M79662 Pain in left lower leg: Secondary | ICD-10-CM | POA: Diagnosis not present

## 2018-03-31 DIAGNOSIS — M79605 Pain in left leg: Secondary | ICD-10-CM | POA: Diagnosis not present

## 2018-03-31 DIAGNOSIS — M25551 Pain in right hip: Secondary | ICD-10-CM | POA: Diagnosis not present

## 2018-03-31 DIAGNOSIS — M25552 Pain in left hip: Secondary | ICD-10-CM | POA: Diagnosis not present

## 2018-03-31 DIAGNOSIS — M5416 Radiculopathy, lumbar region: Secondary | ICD-10-CM | POA: Diagnosis not present

## 2018-03-31 DIAGNOSIS — M25512 Pain in left shoulder: Secondary | ICD-10-CM | POA: Diagnosis not present

## 2018-03-31 LAB — CBC WITH DIFFERENTIAL/PLATELET
Basophils Absolute: 0 x10E3/uL (ref 0.0–0.2)
Basos: 0 %
EOS (ABSOLUTE): 0.1 x10E3/uL (ref 0.0–0.4)
Eos: 2 %
Hematocrit: 37.1 % (ref 34.0–46.6)
Hemoglobin: 11.7 g/dL (ref 11.1–15.9)
Immature Grans (Abs): 0 x10E3/uL (ref 0.0–0.1)
Immature Granulocytes: 0 %
Lymphocytes Absolute: 2.9 x10E3/uL (ref 0.7–3.1)
Lymphs: 44 %
MCH: 29.2 pg (ref 26.6–33.0)
MCHC: 31.5 g/dL (ref 31.5–35.7)
MCV: 93 fL (ref 79–97)
Monocytes Absolute: 0.6 x10E3/uL (ref 0.1–0.9)
Monocytes: 9 %
Neutrophils Absolute: 3.1 x10E3/uL (ref 1.4–7.0)
Neutrophils: 45 %
Platelets: 362 x10E3/uL (ref 150–450)
RBC: 4.01 x10E6/uL (ref 3.77–5.28)
RDW: 17.6 % — ABNORMAL HIGH (ref 12.3–15.4)
WBC: 6.7 x10E3/uL (ref 3.4–10.8)

## 2018-03-31 LAB — COMPREHENSIVE METABOLIC PANEL WITH GFR
ALT: 9 IU/L (ref 0–32)
AST: 13 IU/L (ref 0–40)
Albumin/Globulin Ratio: 1.4 (ref 1.2–2.2)
Albumin: 4.2 g/dL (ref 3.5–5.5)
Alkaline Phosphatase: 61 IU/L (ref 39–117)
BUN/Creatinine Ratio: 14 (ref 9–23)
BUN: 10 mg/dL (ref 6–24)
Bilirubin Total: <0.2 mg/dL (ref 0.0–1.2)
CO2: 22 mmol/L (ref 20–29)
Calcium: 9.6 mg/dL (ref 8.7–10.2)
Chloride: 101 mmol/L (ref 96–106)
Creatinine, Ser: 0.69 mg/dL (ref 0.57–1.00)
GFR calc Af Amer: 122 mL/min/1.73
GFR calc non Af Amer: 105 mL/min/1.73
Globulin, Total: 2.9 g/dL (ref 1.5–4.5)
Glucose: 87 mg/dL (ref 65–99)
Potassium: 4.3 mmol/L (ref 3.5–5.2)
Sodium: 138 mmol/L (ref 134–144)
Total Protein: 7.1 g/dL (ref 6.0–8.5)

## 2018-03-31 NOTE — Telephone Encounter (Signed)
-----   Message from Antony Blackbird, MD sent at 03/31/2018  9:00 AM EDT ----- Please notify patient that her blood work (CMP and CBC) done at her recent visit was normal

## 2018-03-31 NOTE — Telephone Encounter (Signed)
Patient was called and informed to contact office for lab results.   If patient returns phone call please inform him of lab results below.

## 2018-04-01 LAB — URINE CULTURE

## 2018-04-04 ENCOUNTER — Ambulatory Visit (INDEPENDENT_AMBULATORY_CARE_PROVIDER_SITE_OTHER): Payer: Medicaid Other | Admitting: Orthopaedic Surgery

## 2018-04-04 ENCOUNTER — Telehealth (INDEPENDENT_AMBULATORY_CARE_PROVIDER_SITE_OTHER): Payer: Self-pay

## 2018-04-04 ENCOUNTER — Ambulatory Visit (INDEPENDENT_AMBULATORY_CARE_PROVIDER_SITE_OTHER): Payer: Medicaid Other

## 2018-04-04 ENCOUNTER — Telehealth: Payer: Self-pay | Admitting: *Deleted

## 2018-04-04 ENCOUNTER — Encounter (INDEPENDENT_AMBULATORY_CARE_PROVIDER_SITE_OTHER): Payer: Self-pay | Admitting: Orthopaedic Surgery

## 2018-04-04 DIAGNOSIS — M25562 Pain in left knee: Secondary | ICD-10-CM

## 2018-04-04 DIAGNOSIS — Z6841 Body Mass Index (BMI) 40.0 and over, adult: Secondary | ICD-10-CM

## 2018-04-04 MED ORDER — DICLOFENAC SODIUM 1 % TD GEL: 2.0000 g | Freq: Four times a day (QID) | TRANSDERMAL | 1 refills | Status: DC

## 2018-04-04 MED ORDER — MELOXICAM 7.5 MG PO TABS: 7.5000 mg | ORAL_TABLET | Freq: Every day | ORAL | 5 refills | Status: DC | PRN

## 2018-04-04 MED FILL — VOLTAREN 1% GEL: 1 | 12 days supply | Qty: 100 | Fill #0

## 2018-04-04 NOTE — Telephone Encounter (Signed)
Left message on voicemail to return call. Unable to give results.    Notes recorded by Antony Blackbird, MD on 04/03/2018 at 12:51 PM EDT Urine culture does not indicate the presence of a UTI; no antibiotic treatment needed

## 2018-04-04 NOTE — Telephone Encounter (Signed)
Patient given [employer] Patient Assistance application for Monovisc injection, left knee to complete and return to our office.

## 2018-04-04 NOTE — Progress Notes (Signed)
Office Visit Note   Patient: Shari Smith           Date of Birth: 1971-04-24           MRN: [id number] Visit Date: 04/04/2018              Requested by: Antony Blackbird, MD [place], [place] [address] PCP: System, Pcp Not In   Assessment & Plan: Visit Diagnoses:  1. Acute pain of left knee   2. Morbid obesity ([place])   3. Body mass index 40.0-44.9, adult (H[facility])     Plan: Impression is left knee primary localized osteoarthritis.  At this point, the patient has failed cortisone injection.  We have given her paperwork to fill out strength and approval for Visco supplementation injection.  In the meantime, we will start her on oral and topical anti-inflammatories, provide her with a knee sleeve and sent her to formal physical therapy to work on quadriceps and hamstring strengthening.  She will follow-up with [place] as needed.  Call with concerns or questions.  Follow-Up Instructions: Return if symptoms worsen or fail to improve.   Orders:  Orders Placed This Encounter  Procedures  . XR KNEE 3 VIEW LEFT   Meds ordered this encounter  Medications  . meloxicam (MOBIC) 7.5 MG tablet    Sig: Take 1 tablet (7.5 mg total) by mouth daily as needed for pain.    Dispense:  30 tablet    Refill:  5  . diclofenac sodium (VOLTAREN) 1 % GEL    Sig: Apply 2 g topically 4 (four) times daily.    Dispense:  1 Tube    Refill:  1      Procedures: No procedures performed   Clinical Data: No additional findings.   Subjective: Chief Complaint  Patient presents with  . Left Knee - Pain    HPI patient is a pleasant 47 year old female who presents to our clinic today with left knee pain.  This began approximately 6 months ago without any known injury or change in activity.  The pain she has is to the entire knee and does also radiate down the posterior aspect of her leg.  She describes this as a constant ache with worsening pain going up and down stairs, squatting, pivoting as  well as walking.  She also notes moderate stiffness and occasional giving way.  Only thing that seems to improve her symptoms are elevation.  She has tried Mobic in the past moderately for symptoms.  No numbness tingling burning.  No known lumbar hip pathology.  She has had 2 previous cortisone injections to the left knee with the last one being a few months ago.  This only helped for a few days.  She does not want to repeat cortisone injection.  Review of Systems as detailed in HPI.  All others reviewed and are negative.   Objective: Vital Signs: There were no vitals taken for this visit.  Physical Exam well-developed well-nourished female no acute distress.  Alert and oriented x3.  Ortho Exam examination of the left knee shows no effusion.  Range of motion 0 to 120 degrees.  Moderate medial joint line tenderness.  Slight patellofemoral crepitus.  She is stable to valgus varus stress.  Negative logroll and negative straight leg raise.  She is neurovascularly intact distally.  Specialty Comments:  No specialty comments available.  Imaging: Xr Knee 3 View Left  Result Date: 04/04/2018 Moderate medial compartment degenerative changes  PMFS History: Patient Active Problem List   Diagnosis Date Noted  . Acute pain of left knee 03/30/2018  . 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 Focal nodular hyperplasia of liver 10/25/2014   Past Medical History:  Diagnosis Date  . 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 Anxiety   . Hypertension   . Thyroid disease     Family History  Problem Relation Age of Onset  . Hypertension Mother   . Cancer Mother   . Hypertension Father     Past Surgical History:  Procedure Laterality Date  . CHOLECYSTECTOMY    . TUBAL LIGATION     Social History   Occupational History  . Not on file  Tobacco Use  . Smoking status: Current Every Day Smoker    Packs/day: 0.25    Types: Cigarettes  . Smokeless tobacco: Never Used  Substance and  Sexual Activity  . Alcohol use: Yes    Comment: occasionally  . Drug use: No  . Sexual activity: Not on file

## 2018-04-05 DIAGNOSIS — F3110 Bipolar disorder, current episode manic without psychotic features, unspecified: Secondary | ICD-10-CM | POA: Diagnosis not present

## 2018-04-05 DIAGNOSIS — F411 Generalized anxiety disorder: Secondary | ICD-10-CM | POA: Diagnosis not present

## 2018-04-05 NOTE — Telephone Encounter (Signed)
Mrs. Winthrop is aware of lab results. Voices no questions at this time.

## 2018-04-05 NOTE — Progress Notes (Signed)
Please see if patient needs the medications recently prescribed by Orthopedics rewritten for our pharmacy. Also, please update her medication list by adding the medications from her Ortho note

## 2018-04-05 NOTE — Progress Notes (Signed)
Cma attempted to call patient no answer, vm left asking patient to return call. If patient returns call please ask the patient if she has picked up her medications from her requested pharmacy yet.

## 2018-04-06 ENCOUNTER — Telehealth (INDEPENDENT_AMBULATORY_CARE_PROVIDER_SITE_OTHER): Payer: Self-pay | Admitting: Orthopaedic Surgery

## 2018-04-06 ENCOUNTER — Other Ambulatory Visit (INDEPENDENT_AMBULATORY_CARE_PROVIDER_SITE_OTHER): Payer: Self-pay | Admitting: Orthopaedic Surgery

## 2018-04-06 ENCOUNTER — Ambulatory Visit (INDEPENDENT_AMBULATORY_CARE_PROVIDER_SITE_OTHER): Payer: Self-pay | Admitting: Family Medicine

## 2018-04-06 ENCOUNTER — Other Ambulatory Visit (INDEPENDENT_AMBULATORY_CARE_PROVIDER_SITE_OTHER): Payer: Self-pay

## 2018-04-06 MED ORDER — DICLOFENAC SODIUM 75 MG PO TBEC: 75.0000 mg | DELAYED_RELEASE_TABLET | Freq: Two times a day (BID) | ORAL | 0 refills | Status: DC

## 2018-04-06 NOTE — Telephone Encounter (Signed)
Patient said she has been taking meloxicam for 3-4 months and it hasnt helped her any. She is requesting something stronger for pain. Please advise # [phone]

## 2018-04-06 NOTE — Telephone Encounter (Signed)
Please advise 

## 2018-04-06 NOTE — Telephone Encounter (Signed)
Diclofenac 75 mg bid #30

## 2018-04-06 NOTE — Telephone Encounter (Signed)
[facility]ed [facility] per [facility] request

## 2018-04-07 ENCOUNTER — Telehealth (INDEPENDENT_AMBULATORY_CARE_PROVIDER_SITE_OTHER): Payer: Self-pay

## 2018-04-07 NOTE — Telephone Encounter (Signed)
Pt states she checked with her pharmacy and they did not get the rx yesterday. Please resend. [facility].

## 2018-04-07 NOTE — Telephone Encounter (Signed)
rx sent to pharm

## 2018-04-26 DIAGNOSIS — M25551 Pain in right hip: Secondary | ICD-10-CM | POA: Diagnosis not present

## 2018-04-26 DIAGNOSIS — M79662 Pain in left lower leg: Secondary | ICD-10-CM | POA: Diagnosis not present

## 2018-04-26 DIAGNOSIS — M25552 Pain in left hip: Secondary | ICD-10-CM | POA: Diagnosis not present

## 2018-04-26 DIAGNOSIS — G894 Chronic pain syndrome: Secondary | ICD-10-CM | POA: Diagnosis not present

## 2018-04-26 DIAGNOSIS — M79605 Pain in left leg: Secondary | ICD-10-CM | POA: Diagnosis not present

## 2018-05-10 ENCOUNTER — Telehealth: Payer: Self-pay | Admitting: Family Medicine

## 2018-05-11 ENCOUNTER — Other Ambulatory Visit: Payer: Self-pay

## 2018-05-11 ENCOUNTER — Ambulatory Visit (HOSPITAL_BASED_OUTPATIENT_CLINIC_OR_DEPARTMENT_OTHER): Payer: Medicaid Other | Admitting: Licensed Clinical Social Worker

## 2018-05-11 ENCOUNTER — Ambulatory Visit: Payer: Medicaid Other | Attending: Family Medicine | Admitting: Family Medicine

## 2018-05-11 ENCOUNTER — Encounter: Payer: Self-pay | Admitting: Family Medicine

## 2018-05-11 VITALS — BP 114/82 | HR 70 | Temp 98.7°F | Resp 18 | Ht 67.0 in | Wt 275.4 lb

## 2018-05-11 DIAGNOSIS — D3502 Benign neoplasm of left adrenal gland: Secondary | ICD-10-CM | POA: Insufficient documentation

## 2018-05-11 DIAGNOSIS — G8929 Other chronic pain: Secondary | ICD-10-CM

## 2018-05-11 DIAGNOSIS — R1084 Generalized abdominal pain: Secondary | ICD-10-CM

## 2018-05-11 DIAGNOSIS — F419 Anxiety disorder, unspecified: Secondary | ICD-10-CM | POA: Insufficient documentation

## 2018-05-11 DIAGNOSIS — F17210 Nicotine dependence, cigarettes, uncomplicated: Secondary | ICD-10-CM | POA: Diagnosis not present

## 2018-05-11 DIAGNOSIS — I10 Essential (primary) hypertension: Secondary | ICD-10-CM | POA: Diagnosis present

## 2018-05-11 DIAGNOSIS — M25562 Pain in left knee: Secondary | ICD-10-CM

## 2018-05-11 DIAGNOSIS — T783XXA Angioneurotic edema, initial encounter: Secondary | ICD-10-CM

## 2018-05-11 DIAGNOSIS — Z8249 Family history of ischemic heart disease and other diseases of the circulatory system: Secondary | ICD-10-CM | POA: Insufficient documentation

## 2018-05-11 DIAGNOSIS — X58XXXA Exposure to other specified factors, initial encounter: Secondary | ICD-10-CM | POA: Insufficient documentation

## 2018-05-11 DIAGNOSIS — F411 Generalized anxiety disorder: Secondary | ICD-10-CM

## 2018-05-11 DIAGNOSIS — E079 Disorder of thyroid, unspecified: Secondary | ICD-10-CM | POA: Diagnosis not present

## 2018-05-11 DIAGNOSIS — F331 Major depressive disorder, recurrent, moderate: Secondary | ICD-10-CM

## 2018-05-11 MED ORDER — AMLODIPINE BESYLATE 5 MG PO TABS: 5.0000 mg | ORAL_TABLET | Freq: Every day | ORAL | 6 refills | Status: DC

## 2018-05-11 MED ORDER — PREDNISONE 20 MG PO TABS: ORAL_TABLET | ORAL | 0 refills | Status: DC

## 2018-05-11 NOTE — Progress Notes (Signed)
Flu: no Pain: stomach back and both side,left knee fall: knee gave out, no injuries just bruises, swelling  Lip swollen up 1 wk ago after she ate liver pudding.  Refill: lisinopril,

## 2018-05-11 NOTE — Progress Notes (Signed)
Subjective:    Patient ID: Shari Smith, female    DOB: 08/29/1970, 47 y.o.   MRN: [id number]  HPI 47 year old female who was seen in follow-up of hypertension for which she had been taking lisinopril but patient reports that last week she a liver pudding sandwich and afterwards noticed that she had some tingling in her lips and she began to have onset of swelling in her lips, especially the lower lip.  Patient states that this eventually went away however when she had the same type of sandwich a few days later, the swelling in her lips continued until she took Benadryl.  Patient is not sure if it was the liver pudding that other swelling states that she also was told that her blood pressure medicine can also cause lip swelling.  Patient did not have any sensation of swelling of her tongue or throat and no shortness of breath.  Patient has continued to take her blood pressure medicine.  Patient still feels as if her lips are slightly swollen.      Patient also reports continued, chronic left knee pain patient states that she continues to have pain on the left outside of her knee as well as the back of her knee.  Patient states that sometimes rubbing her knee with the for treatment of arthritis actually seems to make her knee feel worse that she has stopped the use of this medication.  Patient reports that she has had a recent fall when she felt as if her left knee suddenly gave out.  Patient does not believe that she had any significant injury secondary to her fall.  Patient reports that her knee pain ranges from a 6 to a 9 on a 0-to-10 scale and is dull and aching.  Knee pain is worse after she has been sitting and then attempts to get up and start walking.      Patient also feels as if she has some generalized abdominal pain.  Patient states that she has had issues with generalized abdominal pain in the past.  Patient denies any current nausea, no blood in the stool or dark stools.  Patient states  that she has had repair of abdominal hernias with mesh and she believes that this is the cause of her pain.  Patient states that she has a generalized aching pain that is about a 6-7 on a 0-to-10 scale on a daily basis.  Patient also has chronic lower back pain.  Patient denies urinary frequency or dysuria at today's visit. Past Medical History:  Diagnosis Date  . 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 Anxiety   . Hypertension   . Thyroid disease    Past Surgical History:  Procedure Laterality Date  . CHOLECYSTECTOMY    . TUBAL LIGATION     Family History  Problem Relation Age of Onset  . Hypertension Mother   . Cancer Mother   . Hypertension Father    Social History   Tobacco Use  . Smoking status: Current Every Day Smoker    Packs/day: 0.25    Types: Cigarettes  . Smokeless tobacco: Never Used  Substance Use Topics  . Alcohol use: Yes    Comment: occasionally  . Drug use: No   Allergies  Allergen Reactions  . Strawberry Extract Hives    Strawberry ice cream allergy    Review of Systems  Constitutional: Positive for fatigue. Negative for chills and fever.  HENT: Positive for facial swelling. Negative for trouble  swallowing.   Respiratory: Negative for cough and shortness of breath.   Cardiovascular: Negative for chest pain and palpitations.  Gastrointestinal: Positive for abdominal pain. Negative for blood in stool.  Genitourinary: Negative for dysuria and frequency.  Musculoskeletal: Positive for arthralgias, back pain, gait problem and joint swelling.  Neurological: Negative for dizziness and headaches.  Psychiatric/Behavioral: Negative for suicidal ideas. The patient is nervous/anxious.        Objective:   Physical Exam BP 114/82   Pulse 70   Temp 98.7 F (37.1 C) (Oral)   Resp 18   Ht 5\' 7"  (1.702 m)   Wt 275 lb 6.4 oz (124.9 kg)   SpO2 96%   BMI 43.13 kg/m  Vital signs and nurse's notes reviewed General-well-nourished, well-developed obese female  in no acute distress ENT- TMs gray, mild edema of the nasal turbinates, patient with mild posterior pharynx erythema but no evidence of acute swelling of oral mucosal tissue or tongue.  Patient does have some generalized edema of both the upper and lower lip with lower lip being more prominent Neck-supple, no lymphadenopathy, no thyromegaly Lungs-clear to auscultation bilaterally. Cardiovascular-regular rate and rhythm Abdomen-truncal obesity, soft, patient with complaint of generalized tenderness to palpation, no guarding Back-no CVA tenderness, patient with some lumbosacral discomfort to palpation and mild thoracolumbar paraspinous spasm Musculoskeletal-patient with lateral left knee joint line tenderness and complaint of pain with knee range of motion and patient with complaint of discomfort along the posterior aspect of the knee but no abnormalities noted on palpation      Assessment & Plan:  1. Essential hypertension Patient's blood pressures well controlled at today's visit on her current lisinopril but unfortunately this medication will need to be discontinued as patient with recent episodes of lip swelling which likely represent angioedema related to her use of lisinopril.  Patient will be placed on Norvasc and should return for recheck of blood pressure in a few weeks - amLODipine (NORVASC) 5 MG tablet; Take 1 tablet (5 mg total) by mouth daily. Once daily to lower blood pressure  Dispense: 30 tablet; Refill: 6  2. Generalized abdominal pain Patient with complaint of continued generalized abdominal pain which she believes is related to prior hernia repair with mesh.  Patient will be referred to surgery for further evaluation and treatment - Ambulatory referral to General Surgery  3. Angioedema, initial encounter Patient with complaint of lip swelling which likely represents angioedema.  Patient has been placed on a prednisone taper and should take Benadryl if she has itching of her  mouth/lips.  Patient has been told to discontinue use of lisinopril and new blood pressure medication has been ordered - predniSONE (DELTASONE) 20 MG tablet; 2 pills today then 1 pill x 2 days then 1/2 pill x 2 days  Dispense: 5 tablet; Refill: 0  4. Chronic pain of left knee Patient is to follow-up with orthopedics regarding her chronic left knee pain though patient does not feel that was so far has been helpful  * Influenza immunization was offered at today's visit which patient declined  An After Visit Summary was printed and given to the patient.  Return in about 4 months (around 09/10/2018) for BP check with [facility] Joiner in 4 weeks.

## 2018-05-11 NOTE — BH Specialist Note (Signed)
Integrated Behavioral Health Initial Visit  MRN: [id number] Name: Shari Smith  Number of [place] Clinician visits:: 1/6 Session Start time: 12:00 PM  Session End time: 12:30 PM Total time: 30 minutes  Type of Service: [place] Interpretor:No. Interpretor Name and Language: N/A   Warm Hand Off Completed.       SUBJECTIVE: Shari Smith is a 47 y.o. female accompanied by self Patient was referred by Dr. Chapman Fitch for depression and anxiety. Patient reports the following symptoms/concerns: feelings of sadness and worry about health, difficulty sleeping, low energy, feeling bad about self, decreased concentration, and irritability Duration of problem: Ongoing; Severity of problem: moderate  OBJECTIVE: Mood: Anxious and Affect: Appropriate Risk of harm to self or others: No plan to harm self or others  LIFE CONTEXT: Family and Social: Pt has an adult daughter that she speaks with frequently. She has immediate family members that reside locally; however, they provide limited support School/Work: Pt has stable housing through [employer]. She has been experiencing financial strain Self-Care: Pt attends church to cope with stressors. She has hx of participating in medication management. She has an upcoming therapist appointment with [facility] on 05/31/18 Life Changes: Pt has ongoing medical conditions and is experiencing financial strain  GOALS ADDRESSED: Patient will: 1. Reduce symptoms of: anxiety and depression 2. Increase knowledge and/or ability of: coping skills and healthy habits  3. Demonstrate ability to: Increase adequate support systems for patient/family  INTERVENTIONS: Interventions utilized: Supportive Counseling, Psychoeducation and/or Health Education and Link to [employer]  Standardized Assessments completed: GAD-7 and PHQ 2&9  ASSESSMENT: Patient currently experiencing  depression and anxiety triggered by ongoing medical conditions and financial strain. She reports feelings of sadness and worry about health, difficulty sleeping, low energy, feeling bad about self, decreased concentration, and irritability. She receives limited support from family.   Patient has hx of participating in medication management. She has an upcoming therapist appointment with [facility] on 05/31/18. LCSWA educated pt on the correlation between one's physical and mental health. Healthy coping skills were discussed to assist pt in managing and/or decreasing symptoms. Pt was provided utility assistance resources to address financial strain.   PLAN: 1. Follow up with behavioral health clinician on : Pt was encouraged to contact LCSWA if symptoms worsen or fail to improve to schedule behavioral appointments at [facility]. 2. Behavioral recommendations: LCSWA recommends that pt apply healthy coping skills discussed and utilize provided resources. Pt is encouraged to schedule follow up appointment with LCSWA 3. Referral(s): [place] (In Clinic) and [employer] Resources:  Finances 4. "From scale of 1-10, how likely are you to follow plan?":   Rebekah Chesterfield, LCSW 05/12/18 10:21 AM

## 2018-05-15 ENCOUNTER — Telehealth: Payer: Self-pay | Admitting: Family Medicine

## 2018-05-15 NOTE — Telephone Encounter (Signed)
Patient called stating that she will no longer be taking her blood pressure medication because she gets chronic headaches, she would like to be put back on lisinopril.

## 2018-05-15 NOTE — Telephone Encounter (Signed)
Will forward to pcp

## 2018-05-16 NOTE — Telephone Encounter (Signed)
Please let patient know that I cannot put her back on the lisinopril as this medication may have caused the swelling in her lips. She should come in to be seen to have her blood pressure rechecked and in the meantime continue her current medication or if she is having a severe headache she may want to be seen at the [facility]

## 2018-05-17 NOTE — Telephone Encounter (Signed)
Returned pt call with Dr. Chapman Fitch  Pt states since she has stopped the bp medication her headaches has gone away. Pt states she has been on lisinopril for 5 years and never had a issue with it. Pt states she ate a sandwhich that day and that may have caused her swelling. Pt states she talked to the pharmacy about the side effects about the new bp medication and she was having the side effects that pharmacist went over with her  Pt is requesting to be put back on lisinopril. Pt states if Dr. Chapman Fitch is thinking it is the lisinopril pt is requesting a lower dose.   Pt states she stopped taking the bp medicine on Sunday and she has not taken it since then and she has not had no headache.   Pt is wanting rx sent to [facility] on [address].

## 2018-05-18 ENCOUNTER — Other Ambulatory Visit: Payer: Self-pay | Admitting: Family Medicine

## 2018-05-18 DIAGNOSIS — I10 Essential (primary) hypertension: Secondary | ICD-10-CM

## 2018-05-18 MED ORDER — TRIAMTERENE-HCTZ 37.5-25 MG PO TABS: 1.0000 | ORAL_TABLET | Freq: Every day | ORAL | 1 refills | Status: DC

## 2018-05-18 NOTE — Telephone Encounter (Signed)
Contacted pt and went over Dr. Chapman Fitch response pt is schedule to see luke on oct 10

## 2018-05-18 NOTE — Progress Notes (Signed)
Patient called with complaint of newly prescribed blood pressure medication, amlodipine, causing her to have headaches as she says that she spoke with her pharmacist and was told that headaches are a side effect of the medication and patient wants to restart lisinopril. See phone calls. Patient had angioedema at a recent appointment and for this reason her lisinopril was stopped and I do not feel comfortable restarting patient on this medication. RX will be sent into her pharmacy for triamterene-hctz and she will be instructed to return for visit with clinical pharmacist to recheck her BP.

## 2018-05-18 NOTE — Telephone Encounter (Signed)
Please let patient know that I will not refill the lisinopril because of the possibility that this medication caused the swelling in her lips. I will send in a different medication for her blood pressure and I would like for her to make an appointment in 1-2 weeks to see the clinical pharmacist here about her blood pressure

## 2018-05-23 ENCOUNTER — Ambulatory Visit: Payer: Medicaid Other | Admitting: Internal Medicine

## 2018-05-24 DIAGNOSIS — K432 Incisional hernia without obstruction or gangrene: Secondary | ICD-10-CM | POA: Diagnosis not present

## 2018-05-24 DIAGNOSIS — N911 Secondary amenorrhea: Secondary | ICD-10-CM | POA: Diagnosis not present

## 2018-05-24 DIAGNOSIS — J069 Acute upper respiratory infection, unspecified: Secondary | ICD-10-CM | POA: Diagnosis not present

## 2018-05-24 DIAGNOSIS — R1084 Generalized abdominal pain: Secondary | ICD-10-CM | POA: Diagnosis not present

## 2018-05-30 DIAGNOSIS — R1084 Generalized abdominal pain: Secondary | ICD-10-CM | POA: Diagnosis not present

## 2018-05-30 DIAGNOSIS — M5440 Lumbago with sciatica, unspecified side: Secondary | ICD-10-CM | POA: Diagnosis not present

## 2018-05-30 DIAGNOSIS — Z79899 Other long term (current) drug therapy: Secondary | ICD-10-CM | POA: Diagnosis not present

## 2018-05-30 DIAGNOSIS — K432 Incisional hernia without obstruction or gangrene: Secondary | ICD-10-CM | POA: Diagnosis not present

## 2018-06-01 ENCOUNTER — Encounter: Payer: Medicaid Other | Admitting: Pharmacist

## 2018-06-01 DIAGNOSIS — R109 Unspecified abdominal pain: Secondary | ICD-10-CM | POA: Diagnosis not present

## 2018-06-01 DIAGNOSIS — Z5181 Encounter for therapeutic drug level monitoring: Secondary | ICD-10-CM | POA: Diagnosis not present

## 2018-06-01 DIAGNOSIS — Z79899 Other long term (current) drug therapy: Secondary | ICD-10-CM | POA: Diagnosis not present

## 2018-06-02 ENCOUNTER — Telehealth: Payer: Self-pay | Admitting: *Deleted

## 2018-06-02 NOTE — Telephone Encounter (Signed)
Called and spoke with the patient. Scheduled the new appt for 10/15 at 1:15pm. Patient to arrive at 1pm and given instructions

## 2018-06-05 NOTE — Progress Notes (Signed)
[place] at [facility]   Consult Note: New Patient FIRST VISIT   Consult was requested by Dr. Autumn Messing for fibroids  Chief Complaint  Patient presents with  . Fibroids    GYN Oncologic Summary 1. TBD o .  HPI: [name]  is a nice 47 y.o.  P4  In 2010 she had a [place] cholecystectomy and umbilical hernia repair. This was complicated by a stitch in the transverse colon that was removed/oversewn. She states in 2017 she had another hernia repair this time with mesh (by Dr. Rosendo Gros).   She discussed some abdominal pain with her PCP and felt it was due to her hernia surgery (with mesh). She was sent on to General Surgery (Dr. Marlou Starks) who did not feel the pain was due to hernia and ordered followup imaging, noting that she had h/o fibroids on prior imaging.  Pain is generalized to the lower abdomen/pelvis by history today. Crampy, Also notes dysmenorrhea and back pain. Also notes painful periods which are heavy (using up to 3 pads layered at night) and can occur 2 times a month/irregularly. She states her periods have been like this for 2 years, but she has never discussed with her PCP or gone back to a Gyn since her Gyn (Dr. Ruthann Cancer) retired. She does think she had a Pap in the last year.  She denies prior knowledge of fibroids, but has been imaged as far back as 2012 where these were noted.  Of interest from a surgical standpoint she does have an enlarged liver. Chart documents h/o focal nodular hyperplasia of the liver.  Patient's goal is to be free of her chronic pain.  Imported EPIC Oncologic History:   No history exists.    Measurement of disease:  Radiology: Xr Knee 3 View Left  Result Date: 04/04/2018 Moderate medial compartment degenerative changes  .   Outpatient Encounter Medications as of 06/06/2018  Medication Sig  . ALPRAZolam (XANAX) 1 MG tablet Take 1 mg by mouth 3 (three) times daily.  . diclofenac (VOLTAREN) 75  MG EC tablet TAKE 1 TABLET BY MOUTH TWICE DAILY  . esomeprazole (NEXIUM) 20 MG capsule Take 20 mg by mouth daily at 12 noon.  . furosemide (LASIX) 20 MG tablet Take 20 mg by mouth daily.   . diclofenac sodium (VOLTAREN) 1 % GEL Apply 2 g topically 4 (four) times daily. (Patient not taking: Reported on 06/06/2018)  . [DISCONTINUED] meloxicam (MOBIC) 7.5 MG tablet Take 1 tablet (7.5 mg total) by mouth daily. (Patient not taking: Reported on 06/06/2018)  . [DISCONTINUED] meloxicam (MOBIC) 7.5 MG tablet Take 1 tablet (7.5 mg total) by mouth daily as needed for pain. (Patient not taking: Reported on 06/06/2018)  . [DISCONTINUED] ondansetron (ZOFRAN) 4 MG tablet Take 1 tablet (4 mg total) by mouth every 6 (six) hours. (Patient not taking: Reported on 06/06/2018)  . [DISCONTINUED] oxyCODONE-acetaminophen (PERCOCET/ROXICET) 5-325 MG tablet Take 1 tablet by mouth every 6 (six) hours as needed for severe pain. (Patient not taking: Reported on 06/06/2018)  . [DISCONTINUED] predniSONE (DELTASONE) 20 MG tablet 2 pills today then 1 pill x 2 days then 1/2 pill x 2 days (Patient not taking: Reported on 06/06/2018)  . [DISCONTINUED] triamterene-hydrochlorothiazide (MAXZIDE-25) 37.5-25 MG tablet Take 1 tablet by mouth daily. To lower blood pressure; make appointment to have blood pressure checked (Patient not taking: Reported on 06/06/2018)   No facility-administered encounter medications on file as of 06/06/2018.    Allergies  Allergen Reactions  . Lisinopril Swelling    Patient with lip swelling that might be associated with her use of lisinopril therefore this medication will be discontinued  . Strawberry Extract Hives    Strawberry ice cream allergy    Past Medical History:  Diagnosis Date  . 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 Anxiety   . Arthritis   . Hypertension   . Thyroid disease    Past Surgical History:  Procedure Laterality Date  . CHOLECYSTECTOMY    . TUBAL LIGATION          Past  Gynecological History:   GYNECOLOGIC HISTORY:  . No LMP recorded.  . Menarche: 47 years old . P 4 . Contraceptive tubal ligation . HRT NA  . Last Pap states 2019 ; waiting on results Family Hx:  Family History  Problem Relation Age of Onset  . Hypertension Mother   . Cancer Mother   . Hypertension Father   . Breast cancer Sister   . Seizures Brother   . Hypertension Brother   . Diabetes Brother   . Hypertension Brother   . Diabetes Sister    Social Hx:  Marland Kitchen Tobacco use: current every day . Alcohol use: few a year . Illicit Drug use: none . Illicit IV Drug use: none    Review of Systems: Review of Systems  Constitutional: Positive for appetite change and unexpected weight change.  HENT:   Positive for hearing loss.   Respiratory: Positive for cough and shortness of breath.   Cardiovascular: Positive for leg swelling.  Gastrointestinal: Positive for abdominal pain and constipation.  Endocrine: Positive for hot flashes.  Genitourinary: Positive for dyspareunia, menstrual problem and pelvic pain.   Musculoskeletal: Positive for arthralgias, back pain and myalgias.  Neurological: Positive for headaches.  Hematological: Bruises/bleeds easily.  Psychiatric/Behavioral: Positive for depression. The patient is nervous/anxious.   All other systems reviewed and are negative.   Vitals:  Vitals:   06/06/18 1407  BP: (!) 131/95  Pulse: 68  Resp: 20  Temp: 98.5 F (36.9 C)  SpO2: 100%   Vitals:   06/06/18 1407  Weight: 274 lb 3.2 oz (124.4 kg)  Height: 5\' 7"  (1.702 m)   Body mass index is 42.95 kg/m.  Physical Exam: General :  Obese. Well developed, 47 y.o., female in no apparent distress HEENT:  Normocephalic/atraumatic, symmetric, EOMI, eyelids normal Neck:   Supple, no masses.  Lymphatics:  No cervical/ submandibular/ supraclavicular/ infraclavicular/ inguinal adenopathy Respiratory:  Respirations unlabored, no use of accessory muscles CV:   Deferred Breast:   Deferred Musculoskeletal: No CVA tenderness, normal muscle strength. Abdomen:  Obese. Umbilicus with prior scar prominent Soft, non-tender and nondistended. No evidence of hernia. No masses. Extremities:  No lymphedema, no erythema, non-tender. Skin:   Normal inspection Neuro/Psych:  No focal motor deficit, no abnormal mental status. Normal gait. Normal affect. Alert and oriented to person, place, and time  Genito Urinary: Deferred to operating room   Assessment  Fibroids Abnormal Uterine Bleeding ECOG PERFORMANCE STATUS: 1 - Symptomatic but completely ambulatory  Plan  1. Data reviewed ? I independently reviewed the images and the radiology reports and discussed my interpretation with the patient today ? She has a TVUS from 2012 showing 2 main fibroids 2 and 2.7cm ? In the 2018 CT these increased to ~ 4.4 and 4.9cm ? Her liver is large noting the history of focal nodular hyperplasia ? This will need to be kept in mind for port placement  if proceeding to [place] ? I looked at her old operative notes ? She may have some adhesive disease to the colon from her hernia repair 2010 ? She states she had another hernia repair in 2017 with mesh. I do not see that in Epic. We will try to get those records. This too may complicate surgical approach. 2. Pain ? Some of her back pain and the dysmenorrhea may be due to the fibroids 3. I cannot tell her that her abdominal pain is from the fibroids.  4. Abnormal uterine bleeding ? She is having irregular, heavy bleeding for 2 years. ? Her uterus needs to be sampled.  ? I offered in office EMB versus D&C ? She would like to proceed to [facility] ? I will need to do a pelvic and potentially a Pap that day if we don't get the Pap results she states she had recently. 5. Fibroid management ? We discussed there are non-operative and operative options ? In general the recommendation is to try medical management first and then surgical. ? She has not discussed the  fibroids with her Gyn as they have retired. I briefly discussed Mirena IUD and printed her a handout about fibroids/treatment ? If after consideration and the D&C she wishes to proceed to surgery she will need to understand it is elective and we will need to review the associated risks in detail. ? I did explain to her I cannot promise a hysterectomy is going to solve her abdominal pain issues. 6. RTC after D&C to see which direction she wishes to proceed. 7. Dr. Marlou Starks ordered a CT in October 2019 ; we don't have a copy of that report with the referral today. We will try to see if we can get a copy.  Face to face time with patient was 45 minutes. Over 50% of this time was spent on counseling and coordination of care.   Mart Piggs, MD Gynecologic Oncologist 06/06/2018, 3:53 PM    Cc: Johny Chess, MD (Referring Gen Surgery) Antony Blackbird, MD (PCP)

## 2018-06-05 NOTE — H&P (View-Only) (Signed)
[place] at [facility]   Consult Note: New Patient FIRST VISIT   Consult was requested by Dr. Autumn Messing for fibroids  Chief Complaint  Patient presents with  . Fibroids    GYN Oncologic Summary 1. TBD o .  HPI: [name]  is a nice 47 y.o.  P4  In 2010 she had a [place] cholecystectomy and umbilical hernia repair. This was complicated by a stitch in the transverse colon that was removed/oversewn. She states in 2017 she had another hernia repair this time with mesh (by Dr. Rosendo Gros).   She discussed some abdominal pain with her PCP and felt it was due to her hernia surgery (with mesh). She was sent on to General Surgery (Dr. Marlou Starks) who did not feel the pain was due to hernia and ordered followup imaging, noting that she had h/o fibroids on prior imaging.  Pain is generalized to the lower abdomen/pelvis by history today. Crampy, Also notes dysmenorrhea and back pain. Also notes painful periods which are heavy (using up to 3 pads layered at night) and can occur 2 times a month/irregularly. She states her periods have been like this for 2 years, but she has never discussed with her PCP or gone back to a Gyn since her Gyn (Dr. Ruthann Cancer) retired. She does think she had a Pap in the last year.  She denies prior knowledge of fibroids, but has been imaged as far back as 2012 where these were noted.  Of interest from a surgical standpoint she does have an enlarged liver. Chart documents h/o focal nodular hyperplasia of the liver.  Patient's goal is to be free of her chronic pain.  Imported EPIC Oncologic History:   No history exists.    Measurement of disease:  Radiology: Xr Knee 3 View Left  Result Date: 04/04/2018 Moderate medial compartment degenerative changes  .   Outpatient Encounter Medications as of 06/06/2018  Medication Sig  . ALPRAZolam (XANAX) 1 MG tablet Take 1 mg by mouth 3 (three) times daily.  . diclofenac (VOLTAREN) 75  MG EC tablet TAKE 1 TABLET BY MOUTH TWICE DAILY  . esomeprazole (NEXIUM) 20 MG capsule Take 20 mg by mouth daily at 12 noon.  . furosemide (LASIX) 20 MG tablet Take 20 mg by mouth daily.   . diclofenac sodium (VOLTAREN) 1 % GEL Apply 2 g topically 4 (four) times daily. (Patient not taking: Reported on 06/06/2018)  . [DISCONTINUED] meloxicam (MOBIC) 7.5 MG tablet Take 1 tablet (7.5 mg total) by mouth daily. (Patient not taking: Reported on 06/06/2018)  . [DISCONTINUED] meloxicam (MOBIC) 7.5 MG tablet Take 1 tablet (7.5 mg total) by mouth daily as needed for pain. (Patient not taking: Reported on 06/06/2018)  . [DISCONTINUED] ondansetron (ZOFRAN) 4 MG tablet Take 1 tablet (4 mg total) by mouth every 6 (six) hours. (Patient not taking: Reported on 06/06/2018)  . [DISCONTINUED] oxyCODONE-acetaminophen (PERCOCET/ROXICET) 5-325 MG tablet Take 1 tablet by mouth every 6 (six) hours as needed for severe pain. (Patient not taking: Reported on 06/06/2018)  . [DISCONTINUED] predniSONE (DELTASONE) 20 MG tablet 2 pills today then 1 pill x 2 days then 1/2 pill x 2 days (Patient not taking: Reported on 06/06/2018)  . [DISCONTINUED] triamterene-hydrochlorothiazide (MAXZIDE-25) 37.5-25 MG tablet Take 1 tablet by mouth daily. To lower blood pressure; make appointment to have blood pressure checked (Patient not taking: Reported on 06/06/2018)   No facility-administered encounter medications on file as of 06/06/2018.    Allergies  Allergen Reactions  . Lisinopril Swelling    Patient with lip swelling that might be associated with her use of lisinopril therefore this medication will be discontinued  . Strawberry Extract Hives    Strawberry ice cream allergy    Past Medical History:  Diagnosis Date  . 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 Anxiety   . Arthritis   . Hypertension   . Thyroid disease    Past Surgical History:  Procedure Laterality Date  . CHOLECYSTECTOMY    . TUBAL LIGATION          Past  Gynecological History:   GYNECOLOGIC HISTORY:  . No LMP recorded.  . Menarche: 47 years old . P 4 . Contraceptive tubal ligation . HRT NA  . Last Pap states 2019 ; waiting on results Family Hx:  Family History  Problem Relation Age of Onset  . Hypertension Mother   . Cancer Mother   . Hypertension Father   . Breast cancer Sister   . Seizures Brother   . Hypertension Brother   . Diabetes Brother   . Hypertension Brother   . Diabetes Sister    Social Hx:  Marland Kitchen Tobacco use: current every day . Alcohol use: few a year . Illicit Drug use: none . Illicit IV Drug use: none    Review of Systems: Review of Systems  Constitutional: Positive for appetite change and unexpected weight change.  HENT:   Positive for hearing loss.   Respiratory: Positive for cough and shortness of breath.   Cardiovascular: Positive for leg swelling.  Gastrointestinal: Positive for abdominal pain and constipation.  Endocrine: Positive for hot flashes.  Genitourinary: Positive for dyspareunia, menstrual problem and pelvic pain.   Musculoskeletal: Positive for arthralgias, back pain and myalgias.  Neurological: Positive for headaches.  Hematological: Bruises/bleeds easily.  Psychiatric/Behavioral: Positive for depression. The patient is nervous/anxious.   All other systems reviewed and are negative.   Vitals:  Vitals:   06/06/18 1407  BP: (!) 131/95  Pulse: 68  Resp: 20  Temp: 98.5 F (36.9 C)  SpO2: 100%   Vitals:   06/06/18 1407  Weight: 274 lb 3.2 oz (124.4 kg)  Height: 5\' 7"  (1.702 m)   Body mass index is 42.95 kg/m.  Physical Exam: General :  Obese. Well developed, 47 y.o., female in no apparent distress HEENT:  Normocephalic/atraumatic, symmetric, EOMI, eyelids normal Neck:   Supple, no masses.  Lymphatics:  No cervical/ submandibular/ supraclavicular/ infraclavicular/ inguinal adenopathy Respiratory:  Respirations unlabored, no use of accessory muscles CV:   Deferred Breast:   Deferred Musculoskeletal: No CVA tenderness, normal muscle strength. Abdomen:  Obese. Umbilicus with prior scar prominent Soft, non-tender and nondistended. No evidence of hernia. No masses. Extremities:  No lymphedema, no erythema, non-tender. Skin:   Normal inspection Neuro/Psych:  No focal motor deficit, no abnormal mental status. Normal gait. Normal affect. Alert and oriented to person, place, and time  Genito Urinary: Deferred to operating room   Assessment  Fibroids Abnormal Uterine Bleeding ECOG PERFORMANCE STATUS: 1 - Symptomatic but completely ambulatory  Plan  1. Data reviewed ? I independently reviewed the images and the radiology reports and discussed my interpretation with the patient today ? She has a TVUS from 2012 showing 2 main fibroids 2 and 2.7cm ? In the 2018 CT these increased to ~ 4.4 and 4.9cm ? Her liver is large noting the history of focal nodular hyperplasia ? This will need to be kept in mind for port placement  if proceeding to [place] ? I looked at her old operative notes ? She may have some adhesive disease to the colon from her hernia repair 2010 ? She states she had another hernia repair in 2017 with mesh. I do not see that in Epic. We will try to get those records. This too may complicate surgical approach. 2. Pain ? Some of her back pain and the dysmenorrhea may be due to the fibroids 3. I cannot tell her that her abdominal pain is from the fibroids.  4. Abnormal uterine bleeding ? She is having irregular, heavy bleeding for 2 years. ? Her uterus needs to be sampled.  ? I offered in office EMB versus D&C ? She would like to proceed to [facility] ? I will need to do a pelvic and potentially a Pap that day if we don't get the Pap results she states she had recently. 5. Fibroid management ? We discussed there are non-operative and operative options ? In general the recommendation is to try medical management first and then surgical. ? She has not discussed the  fibroids with her Gyn as they have retired. I briefly discussed Mirena IUD and printed her a handout about fibroids/treatment ? If after consideration and the D&C she wishes to proceed to surgery she will need to understand it is elective and we will need to review the associated risks in detail. ? I did explain to her I cannot promise a hysterectomy is going to solve her abdominal pain issues. 6. RTC after D&C to see which direction she wishes to proceed. 7. Dr. Marlou Starks ordered a CT in October 2019 ; we don't have a copy of that report with the referral today. We will try to see if we can get a copy.  Face to face time with patient was 45 minutes. Over 50% of this time was spent on counseling and coordination of care.   Mart Piggs, MD Gynecologic Oncologist 06/06/2018, 3:53 PM    Cc: Johny Chess, MD (Referring Gen Surgery) Antony Blackbird, MD (PCP)

## 2018-06-06 ENCOUNTER — Inpatient Hospital Stay: Payer: Medicaid Other | Attending: Obstetrics | Admitting: Obstetrics

## 2018-06-06 ENCOUNTER — Encounter: Payer: Self-pay | Admitting: Obstetrics

## 2018-06-06 VITALS — BP 131/95 | HR 68 | Temp 98.5°F | Resp 20 | Ht 67.0 in | Wt 274.2 lb

## 2018-06-06 DIAGNOSIS — D219 Benign neoplasm of connective and other soft tissue, unspecified: Secondary | ICD-10-CM | POA: Diagnosis not present

## 2018-06-06 NOTE — Patient Instructions (Signed)
Plan to have a hysteroscopy, dilation and curettage of the uterus at the [facility] on June 22, 2018.  You will receive a phone call from the pre-surgical RN to discuss instructions.  Please call for any questions or concerns.   Dilation and Curettage or Vacuum Curettage  Dilation and curettage (D&C) and vacuum curettage are minor procedures. A D&C involves stretching (dilation) the cervix and scraping (curettage) the inside lining of the uterus (endometrium). During a D&C, tissue is gently scraped from the endometrium, starting from the top portion of the uterus down to the lowest part of the uterus (cervix). During a vacuum curettage, the lining and tissue in the uterus are removed with the use of gentle suction. Curettage may be performed to either diagnose or treat a problem. As a diagnostic procedure, curettage is performed to examine tissues from the uterus. A diagnostic curettage may be done if you have:  Irregular bleeding in the uterus.  Bleeding with the development of clots.  Spotting between menstrual periods.  Prolonged menstrual periods or other abnormal bleeding.  Bleeding after menopause.  No menstrual period (amenorrhea).  A change in size and shape of the uterus.  Abnormal endometrial cells discovered during a Pap test.  As a treatment procedure, curettage may be performed for the following reasons:  Removal of an IUD (intrauterine device).  Removal of retained placenta after giving birth.  Abortion.  Miscarriage.  Removal of endometrial polyps.  Removal of uncommon types of noncancerous lumps (fibroids).  Tell a health care provider about:  Any allergies you have, including allergies to prescribed medicine or latex.  All medicines you are taking, including vitamins, herbs, eye drops, creams, and over-the-counter medicines. This is especially important if you take any blood-thinning medicine. Bring a list of all of your  medicines to your appointment.  Any problems you or family members have had with anesthetic medicines.  Any blood disorders you have.  Any surgeries you have had.  Your medical history and any medical conditions you have.  Whether you are pregnant or may be pregnant.  Recent vaginal infections you have had.  Recent menstrual periods, bleeding problems you have had, and what form of birth control (contraception) you use. What are the risks? Generally, this is a safe procedure. However, problems may occur, including:  Infection.  Heavy vaginal bleeding.  Allergic reactions to medicines.  Damage to the cervix or other structures or organs.  Development of scar tissue (adhesions) inside the uterus, which can cause abnormal amounts of menstrual bleeding. This may make it harder to get pregnant in the future.  A hole (perforation) or puncture in the uterine wall. This is rare.  What happens before the procedure? Staying hydrated Follow instructions from your health care provider about hydration, which may include:  Up to 2 hours before the procedure - you may continue to drink clear liquids, such as water, clear fruit juice, black coffee, and plain tea.  Eating and drinking restrictions  Follow instructions from the surgery nurse.  Medicines  Ask your health care provider about: ? Changing or stopping your regular medicines. This is especially important if you are taking diabetes medicines or blood thinners. ? Taking medicines such as aspirin and ibuprofen. These medicines can thin your blood. Do not take these medicines before your procedure if your health care provider instructs you not to.  You may be given antibiotic medicine to help prevent infection. General instructions  For 24 hours before your procedure, do  not: ? Douche. ? Use tampons. ? Use medicines, creams, or suppositories in the vagina. ? Have sexual intercourse.  You may be given a pregnancy test on  the day of the procedure.  Plan to have someone take you home from the hospital or clinic.  You may have a blood or urine sample taken.  If you will be going home right after the procedure, plan to have someone with you for 24 hours. What happens during the procedure?  To reduce your risk of infection: ? Your health care team will wash or sanitize their hands. ? Your skin will be washed with soap.  An IV tube will be inserted into one of your veins.  You will be given one of the following: ? A medicine that numbs the area in and around the cervix (local anesthetic). ? A medicine to make you fall asleep (general anesthetic).  You will lie down on your back, with your feet in foot rests (stirrups).  The size and position of your uterus will be checked.  A lubricated instrument (speculum or Sims retractor) will be inserted into the back side of your vagina. The speculum will be used to hold apart the walls of your vagina so your health care provider can see your cervix.  A tool (tenaculum) will be attached to the lip of the cervix to stabilize it.  Your cervix will be softened and dilated. This may be done by: ? Taking a medicine. ? Having tapered dilators or thin rods (laminaria) or gradual widening instruments (tapered dilators) inserted into your cervix.  A small, sharp, curved instrument (curette) will be used to scrape a small amount of tissue or cells from the endometrium or cervical canal. In some cases, gentle suction is applied with the curette. The curette will then be removed. The cells will be taken to a lab for testing. The procedure may vary among health care providers and hospitals. What happens after the procedure?  You may have mild cramping, backache, pain, and light bleeding or spotting. You may pass small blood clots from your vagina.  You may have to wear compression stockings. These stockings help to prevent blood clots and reduce swelling in your legs.  Your  blood pressure, heart rate, breathing rate, and blood oxygen level will be monitored until the medicines you were given have worn off. Summary  Dilation and curettage (D&C) involves stretching (dilation) the cervix and scraping (curettage) the inside lining of the uterus (endometrium).  After the procedure, you may have mild cramping, backache, pain, and light bleeding or spotting. You may pass small blood clots from your vagina.  Plan to have someone take you home from the hospital or clinic. This information is not intended to replace advice given to you by your health care provider. Make sure you discuss any questions you have with your health care provider. Document Released: 08/09/2005 Document Revised: 04/25/2016 Document Reviewed: 04/25/2016 Elsevier Interactive Patient Education  2018 [employer].

## 2018-06-08 ENCOUNTER — Encounter: Payer: Medicaid Other | Admitting: Pharmacist

## 2018-06-08 ENCOUNTER — Other Ambulatory Visit: Payer: Self-pay | Admitting: General Surgery

## 2018-06-08 DIAGNOSIS — R1084 Generalized abdominal pain: Secondary | ICD-10-CM

## 2018-06-14 ENCOUNTER — Ambulatory Visit
Admission: RE | Admit: 2018-06-14 | Discharge: 2018-06-14 | Disposition: A | Discharge status: Home / Self Care | Payer: Medicaid Other | Source: Ambulatory Visit | Attending: General Surgery | Admitting: General Surgery

## 2018-06-14 DIAGNOSIS — R1084 Generalized abdominal pain: Secondary | ICD-10-CM

## 2018-06-14 DIAGNOSIS — K429 Umbilical hernia without obstruction or gangrene: Secondary | ICD-10-CM | POA: Diagnosis not present

## 2018-06-14 MED ORDER — IOPAMIDOL (ISOVUE-300) INJECTION 61%
125.0000 mL | Freq: Once | INTRAVENOUS | Status: AC | PRN
  Administered 2018-06-14: 125 mL via INTRAVENOUS

## 2018-06-15 ENCOUNTER — Encounter (HOSPITAL_BASED_OUTPATIENT_CLINIC_OR_DEPARTMENT_OTHER): Payer: Self-pay | Admitting: *Deleted

## 2018-06-15 ENCOUNTER — Other Ambulatory Visit: Payer: Self-pay

## 2018-06-15 NOTE — Progress Notes (Signed)
Spoke with patient via telephone for pre op interview. NPO after MN. Patient to take nexium and norvasc AM of surgery with a sip of water. Needs ISTAT8, EKG, and UPT AM of surgery. Arrival time 0900.

## 2018-06-16 ENCOUNTER — Telehealth: Payer: Self-pay | Admitting: *Deleted

## 2018-06-16 NOTE — Telephone Encounter (Signed)
Patient called in asking whether or not she needed to have a Pap smear repeated before her surgery, I spoke with Joylene John, NP and wanted me to inform the patient that they will do a pap during her surgical procedure.  Patient had additional questions about the surgery, I informed her that our office would let Dr. Gerarda Fraction know that she had additional questions.

## 2018-06-19 ENCOUNTER — Telehealth: Payer: Self-pay

## 2018-06-19 NOTE — Telephone Encounter (Signed)
Received phone call from pt earlier regarding whether Dr Gerarda Fraction can review CT abd/ pelvis from 10/23 -pt reports she was told issues may be from fibroids and not hernia and would like to discuss with her further.  I let her know I would call her back after speaking with [facility] NP.   Per Lenna Sciara NP,  Dr Gerarda Fraction will review the report and we'll call her back.  Attempted to call her back, no answer, no option to leave VM- VM full.

## 2018-06-20 ENCOUNTER — Telehealth: Payer: Self-pay

## 2018-06-20 NOTE — Telephone Encounter (Signed)
See my previous note from yesterday. Outgoing call to patient and she said Dr Gerarda Fraction already called her back yesterday, answered her questions and her surgery is still set for this Thursday.  No other needs per pt at this time.

## 2018-06-21 ENCOUNTER — Telehealth: Payer: Self-pay | Admitting: *Deleted

## 2018-06-21 NOTE — Telephone Encounter (Signed)
Returned patient's call.  Patient had some concerns about her surgical procedure.  Patient states that her fibroids are very painful and she really wanted them to be taken out.   I informed the patient that I would let Dr. Gerarda Fraction know of her concerns. Per Dr. Gerarda Fraction a sampling of the uterus prior to having a bigger surgery is necessary to make sure there is no cancer. Patient states that she understands and stated that she would be at [facility] at [place] in the morning(10/31) for her surgical procedure.  No other concerns for the patient at this time.

## 2018-06-21 NOTE — Anesthesia Preprocedure Evaluation (Addendum)
Anesthesia Evaluation  Patient identified by MRN, date of birth, ID band Patient awake    Reviewed: Allergy & Precautions, NPO status , Patient's Chart, lab work & pertinent test results  History of Anesthesia Complications Negative for: history of anesthetic complications  Airway Mallampati: III  TM Distance: >3 FB Neck ROM: Full    Dental  (+) Dental Advisory Given   Pulmonary Current Smoker,    breath sounds clear to auscultation       Cardiovascular hypertension, Pt. on medications (-) angina Rhythm:Regular Rate:Normal     Neuro/Psych Anxiety negative neurological ROS     GI/Hepatic Neg liver ROS, GERD  Medicated and Poorly Controlled,  Endo/Other  Morbid obesityAdrenal adenoma  Renal/GU negative Renal ROS     Musculoskeletal   Abdominal (+) + obese,   Peds  Hematology negative hematology ROS (+)   Anesthesia Other Findings   Reproductive/Obstetrics S/p BTL                           Anesthesia Physical Anesthesia Plan  ASA: III  Anesthesia Plan: General   Post-op Pain Management:    Induction: Intravenous  PONV Risk Score and Plan: 2 and Ondansetron, Dexamethasone and Scopolamine patch - Pre-op  Airway Management Planned: Oral ETT  Additional Equipment:   Intra-op Plan:   Post-operative Plan: Extubation in OR  Informed Consent: I have reviewed the patients History and Physical, chart, labs and discussed the procedure including the risks, benefits and alternatives for the proposed anesthesia with the patient or authorized representative who has indicated his/her understanding and acceptance.   Dental advisory given  Plan Discussed with: CRNA and Surgeon  Anesthesia Plan Comments: (Plan routine monitors, GETA)        Anesthesia Quick Evaluation

## 2018-06-21 NOTE — Progress Notes (Signed)
Pt informed to arrive at 0630 tomorrow morning for surgery.

## 2018-06-22 ENCOUNTER — Encounter (HOSPITAL_BASED_OUTPATIENT_CLINIC_OR_DEPARTMENT_OTHER)
Admission: RE | Disposition: A | Discharge status: Home / Self Care | Payer: Self-pay | Source: Ambulatory Visit | Attending: Obstetrics

## 2018-06-22 ENCOUNTER — Ambulatory Visit (HOSPITAL_BASED_OUTPATIENT_CLINIC_OR_DEPARTMENT_OTHER)
Admission: RE | Admit: 2018-06-22 | Discharge: 2018-06-22 | Disposition: A | Discharge status: Home / Self Care | Payer: Medicaid Other | Source: Ambulatory Visit | Attending: Obstetrics | Admitting: Obstetrics

## 2018-06-22 ENCOUNTER — Other Ambulatory Visit: Payer: Self-pay

## 2018-06-22 ENCOUNTER — Ambulatory Visit (HOSPITAL_BASED_OUTPATIENT_CLINIC_OR_DEPARTMENT_OTHER): Payer: Medicaid Other | Admitting: Anesthesiology

## 2018-06-22 ENCOUNTER — Encounter (HOSPITAL_BASED_OUTPATIENT_CLINIC_OR_DEPARTMENT_OTHER): Payer: Self-pay | Admitting: *Deleted

## 2018-06-22 DIAGNOSIS — D259 Leiomyoma of uterus, unspecified: Secondary | ICD-10-CM | POA: Diagnosis present

## 2018-06-22 DIAGNOSIS — I10 Essential (primary) hypertension: Secondary | ICD-10-CM | POA: Diagnosis not present

## 2018-06-22 DIAGNOSIS — N939 Abnormal uterine and vaginal bleeding, unspecified: Secondary | ICD-10-CM

## 2018-06-22 DIAGNOSIS — N840 Polyp of corpus uteri: Secondary | ICD-10-CM | POA: Insufficient documentation

## 2018-06-22 DIAGNOSIS — F17210 Nicotine dependence, cigarettes, uncomplicated: Secondary | ICD-10-CM | POA: Diagnosis not present

## 2018-06-22 DIAGNOSIS — N938 Other specified abnormal uterine and vaginal bleeding: Secondary | ICD-10-CM | POA: Diagnosis not present

## 2018-06-22 HISTORY — PX: HYSTEROSCOPY W/D&C: SHX1775

## 2018-06-22 LAB — POCT I-STAT, CHEM 8
BUN: 11 mg/dL (ref 6–20)
Calcium, Ion: 1.27 mmol/L (ref 1.15–1.40)
Chloride: 100 mmol/L (ref 98–111)
Creatinine, Ser: 0.6 mg/dL (ref 0.44–1.00)
Glucose, Bld: 97 mg/dL (ref 70–99)
HCT: 40 % (ref 36.0–46.0)
Hemoglobin: 13.6 g/dL (ref 12.0–15.0)
Potassium: 3.8 mmol/L (ref 3.5–5.1)
Sodium: 139 mmol/L (ref 135–145)
TCO2: 26 mmol/L (ref 22–32)

## 2018-06-22 LAB — POCT PREGNANCY, URINE: Preg Test, Ur: NEGATIVE

## 2018-06-22 SURGERY — DILATATION AND CURETTAGE /HYSTEROSCOPY
Anesthesia: General | Site: Uterus

## 2018-06-22 MED ORDER — FENTANYL CITRATE (PF) 100 MCG/2ML IJ SOLN
25.0000 ug | INTRAMUSCULAR | Status: DC | PRN
  Administered 2018-06-22: 25 ug via INTRAVENOUS
  Administered 2018-06-22 (×2): 50 ug via INTRAVENOUS
  Filled 2018-06-22: qty 1

## 2018-06-22 MED ORDER — 0.9 % SODIUM CHLORIDE (POUR BTL) OPTIME
TOPICAL | Status: DC | PRN
  Administered 2018-06-22: 300 mL

## 2018-06-22 MED ORDER — FENTANYL CITRATE (PF) 100 MCG/2ML IJ SOLN
INTRAMUSCULAR | Status: AC
  Filled 2018-06-22: qty 2

## 2018-06-22 MED ORDER — ONDANSETRON HCL 4 MG/2ML IJ SOLN
INTRAMUSCULAR | Status: AC
  Filled 2018-06-22: qty 2

## 2018-06-22 MED ORDER — KETOROLAC TROMETHAMINE 30 MG/ML IJ SOLN
INTRAMUSCULAR | Status: DC | PRN
  Administered 2018-06-22: 30 mg via INTRAVENOUS

## 2018-06-22 MED ORDER — ONDANSETRON HCL 4 MG/2ML IJ SOLN
INTRAMUSCULAR | Status: DC | PRN
  Administered 2018-06-22: 4 mg via INTRAVENOUS

## 2018-06-22 MED ORDER — MIDAZOLAM HCL 2 MG/2ML IJ SOLN
INTRAMUSCULAR | Status: AC
  Filled 2018-06-22: qty 2

## 2018-06-22 MED ORDER — SODIUM CHLORIDE 0.9 % IR SOLN
Status: DC | PRN
  Administered 2018-06-22: 350 mL

## 2018-06-22 MED ORDER — KETOROLAC TROMETHAMINE 30 MG/ML IJ SOLN
INTRAMUSCULAR | Status: AC
  Filled 2018-06-22: qty 1

## 2018-06-22 MED ORDER — MEPERIDINE HCL 25 MG/ML IJ SOLN
6.2500 mg | INTRAMUSCULAR | Status: DC | PRN
  Filled 2018-06-22: qty 1

## 2018-06-22 MED ORDER — PROPOFOL 10 MG/ML IV BOLUS
INTRAVENOUS | Status: DC | PRN
  Administered 2018-06-22: 200 mg via INTRAVENOUS

## 2018-06-22 MED ORDER — LACTATED RINGERS IV SOLN
INTRAVENOUS | Status: DC
  Administered 2018-06-22 (×2): via INTRAVENOUS
  Filled 2018-06-22: qty 1000

## 2018-06-22 MED ORDER — PROPOFOL 10 MG/ML IV BOLUS
INTRAVENOUS | Status: AC
  Filled 2018-06-22: qty 20

## 2018-06-22 MED ORDER — LIDOCAINE 2% (20 MG/ML) 5 ML SYRINGE
INTRAMUSCULAR | Status: AC
  Filled 2018-06-22: qty 5

## 2018-06-22 MED ORDER — PROMETHAZINE HCL 25 MG/ML IJ SOLN
6.2500 mg | INTRAMUSCULAR | Status: DC | PRN
  Filled 2018-06-22: qty 1

## 2018-06-22 MED ORDER — LIDOCAINE HCL (CARDIAC) PF 100 MG/5ML IV SOSY
PREFILLED_SYRINGE | INTRAVENOUS | Status: DC | PRN
  Administered 2018-06-22: 40 mg via INTRAVENOUS

## 2018-06-22 MED ORDER — LIDOCAINE HCL 1 % IJ SOLN
INTRAMUSCULAR | Status: AC
  Filled 2018-06-22: qty 20

## 2018-06-22 MED ORDER — SUCCINYLCHOLINE CHLORIDE 200 MG/10ML IV SOSY
PREFILLED_SYRINGE | INTRAVENOUS | Status: AC
  Filled 2018-06-22: qty 10

## 2018-06-22 MED ORDER — DEXAMETHASONE SODIUM PHOSPHATE 10 MG/ML IJ SOLN
INTRAMUSCULAR | Status: AC
  Filled 2018-06-22: qty 1

## 2018-06-22 MED ORDER — DEXAMETHASONE SODIUM PHOSPHATE 10 MG/ML IJ SOLN
INTRAMUSCULAR | Status: DC | PRN
  Administered 2018-06-22: 8 mg via INTRAVENOUS

## 2018-06-22 MED ORDER — SUCCINYLCHOLINE CHLORIDE 20 MG/ML IJ SOLN
INTRAMUSCULAR | Status: DC | PRN
  Administered 2018-06-22: 160 mg via INTRAVENOUS

## 2018-06-22 MED ORDER — FENTANYL CITRATE (PF) 100 MCG/2ML IJ SOLN
INTRAMUSCULAR | Status: DC | PRN
  Administered 2018-06-22 (×2): 100 ug via INTRAVENOUS

## 2018-06-22 MED ORDER — MIDAZOLAM HCL 5 MG/5ML IJ SOLN
INTRAMUSCULAR | Status: DC | PRN
  Administered 2018-06-22: 2 mg via INTRAVENOUS

## 2018-06-22 MED ORDER — MIDAZOLAM HCL 2 MG/2ML IJ SOLN
0.5000 mg | Freq: Once | INTRAMUSCULAR | Status: DC | PRN
  Filled 2018-06-22: qty 2

## 2018-06-22 SURGICAL SUPPLY — 15 items
CATH ROBINSON RED A/P 16FR (CATHETERS) ×3 IMPLANT
COVER WAND RF STERILE (DRAPES) IMPLANT
GLOVE BIO SURGEON STRL SZ 6.5 (GLOVE) ×2 IMPLANT
GLOVE BIO SURGEONS STRL SZ 6.5 (GLOVE) ×1
GLOVE ECLIPSE 7.0 STRL STRAW (GLOVE) ×3 IMPLANT
GLOVE INDICATOR 7.0 STRL GRN (GLOVE) ×3 IMPLANT
GLOVE INDICATOR 7.5 STRL GRN (GLOVE) ×3 IMPLANT
GOWN STRL REUS W/TWL LRG LVL3 (GOWN DISPOSABLE) ×6 IMPLANT
IV NS IRRIG 3000ML ARTHROMATIC (IV SOLUTION) ×3 IMPLANT
KIT PROCEDURE FLUENT (KITS) ×3 IMPLANT
KIT TURNOVER CYSTO (KITS) ×3 IMPLANT
PACK VAGINAL MINOR WOMEN LF (CUSTOM PROCEDURE TRAY) ×3 IMPLANT
PAD OB MATERNITY 4.3X12.25 (PERSONAL CARE ITEMS) ×3 IMPLANT
TOWEL OR 17X24 6PK STRL BLUE (TOWEL DISPOSABLE) ×6 IMPLANT
WATER STERILE IRR 500ML POUR (IV SOLUTION) ×3 IMPLANT

## 2018-06-22 NOTE — Anesthesia Procedure Notes (Signed)
Procedure Name: Intubation Date/Time: 06/22/2018 8:46 AM Performed by: Jonna Munro, CRNA Pre-anesthesia Checklist: Patient identified, Emergency Drugs available, Suction available and Timeout performed Patient Re-evaluated:Patient Re-evaluated prior to induction Oxygen Delivery Method: Circle system utilized Preoxygenation: Pre-oxygenation with 100% oxygen Induction Type: IV induction, Rapid sequence and Cricoid Pressure applied Laryngoscope Size: Mac and 3 Grade View: Grade I Tube type: Oral Tube size: 7.0 mm Number of attempts: 1 Airway Equipment and Method: Stylet Placement Confirmation: ETT inserted through vocal cords under direct vision,  positive ETCO2 and breath sounds checked- equal and bilateral Secured at: 23 cm Tube secured with: Tape Dental Injury: Teeth and Oropharynx as per pre-operative assessment

## 2018-06-22 NOTE — Interval H&P Note (Signed)
History and Physical Interval Note:  06/22/2018 8:36 AM  Shari Smith  has presented today for surgery, with the diagnosis of ABNORMAL UTERINE BLEEDING  The various methods of treatment have been discussed with the patient and family. After consideration of risks, benefits and other options for treatment, the patient has consented to  Procedure(s): DILATATION AND CURETTAGE /HYSTEROSCOPY PAP SMEAR OF CERIX ENDO CERVICAL CURETTAGE (N/A) as a surgical intervention .  The patient's history has been reviewed, patient examined, no change in status, stable for surgery.  I have reviewed the patient's chart and labs.  Questions were answered to the patient's satisfaction.     [name]

## 2018-06-22 NOTE — Anesthesia Postprocedure Evaluation (Signed)
Anesthesia Post Note  Patient: Cletis Muma [place]  Procedure(s) Performed: DILATATION AND CURETTAGE /HYSTEROSCOPY PAP SMEAR OF CERIX ENDO CERVICAL CURETTAGE (N/A Uterus)     Patient location during evaluation: PACU Anesthesia Type: General Level of consciousness: awake and alert, oriented and patient cooperative Pain management: pain level controlled Vital Signs Assessment: post-procedure vital signs reviewed and stable Respiratory status: spontaneous breathing, nonlabored ventilation and respiratory function stable Cardiovascular status: blood pressure returned to baseline and stable Postop Assessment: no apparent nausea or vomiting Anesthetic complications: no    Last Vitals:  Vitals:   06/22/18 1015 06/22/18 1028  BP: (!) 145/91   Pulse: (!) 58   Resp: 16 16  Temp:  36.8 C  SpO2: 98% 99%    Last Pain:  Vitals:   06/22/18 1028  TempSrc:   PainSc: 5                  Jodelle Fausto,E. Aaliyah Cancro

## 2018-06-22 NOTE — Op Note (Signed)
OPERATIVE NOTE 06/22/18   Preoperative diagnosis: Abnormal uterine bleeding  Postoperative diagnosis: Abnormal uterine bleeding  Procedure:  1. Diagnostic hysteroscopy, Fractional dilatation and curettage 2. Pap smear  Surgeon: Bernita Raisin  Anesthesia: GETA  Estimated blood loss: 1cc  Complications: none  Specimen: Pap smear, Endocervical curettings, Endometrial curettings  Operative Findings: Vaginal wall relaxation. Normal cervix grossly. [place] showed no obvious pathology.   Description of procedure:  The patient was taken to the operating room and placed on the operating table in the semi-lithotomy position in [place].  The patient was prepped and draped in the usual manner.  After a time-out had been completed, a speculum was placed in the vagina.  A pap was performed. An ECC was performed. The anterior lip of the cervix was grasped with a single-toothed tenaculum.    The uterine cavity sounded to 8cm. The endocervical canal was dilated with Kennon Rounds dilators.  A 5 mm diagnostic hysteroscope was used to perform a diagnostic hysteroscopy.  The findings are noted above.    The hysteroscope was removed.  A small, Sims curette was used to perform an endometrial curettage. [place] was reinserted and appropriate sampling appeared to be performed. All the instruments were removed from the vagina. The tenaculum site appeared hemostatic. Final instrument and lap counts were correct.  The patient was taken to the PACU in stable condition.

## 2018-06-22 NOTE — Transfer of Care (Signed)
Immediate Anesthesia Transfer of Care Note  Patient: Shari Smith  Procedure(s) Performed: DILATATION AND CURETTAGE /HYSTEROSCOPY PAP SMEAR OF CERIX ENDO CERVICAL CURETTAGE (N/A Uterus)  Patient Location: PACU  Anesthesia Type:General  Level of Consciousness: awake, alert  and oriented  Airway & Oxygen Therapy: Patient Spontanous Breathing and Patient connected to nasal cannula oxygen  Post-op Assessment: Report given to RN and Post -op Vital signs reviewed and stable  Post vital signs: Reviewed and stable  Last Vitals:  Vitals Value Taken Time  BP    Temp    Pulse    Resp    SpO2      Last Pain:  Vitals:   06/22/18 0743  TempSrc:   PainSc: (P) 9       Patients Stated Pain Goal: (P) 5 (67/59/16 3846)  Complications: No apparent anesthesia complications

## 2018-06-22 NOTE — Discharge Instructions (Signed)
06/22/2018  Return to work: 2 days  Activity: 1. Be up and out of the bed during the day.  Take a nap if needed.  You may walk up steps but be careful and use the hand rail.  Stair climbing will tire you more than you think, you may need to stop part way and rest.   2. No lifting, pulling, pushing, or straining anything more than 5 pounds for 1 week. You may be able to return to full activity at 1 weeks, but this will be further discussed at your postoperative visits.  3. No driving for today. Do Not drive if you are taking narcotic pain medicine.  4. Shower daily.    5. No sexual activity and nothing in the vagina for 2 weeks.  Medications:  - As long as you have never been told to avoid ibuprofen and/or tylenol use these first for pain control. Take these regularly (every 6 hours) to decrease the build up of pain.  - If necessary, for severe pain not relieved by the above, take your pain prescription.  - While taking your prescription pain medication you should take stool softener (I prefer you purchase docusate sodium "Colace") 2-3 times per day to reduce the likelihood of constipation. If this causes diarrhea, stop its use.  Diet: 1. Low sodium Heart Healthy Diet is recommended. 2. It is safe to use a gentle laxative if you have difficulty moving your bowels as long as you have no nausea or vomiting and you are passing flatus.  Wound Care: 1. Keep clean and dry.  Shower daily. 2. If you have steri-strips on your incision these will fall off after 2-3 weeks. At 3 weeks you may take these off carefully after showering.   Reasons to call the Doctor:   Fever - Oral temperature greater than 100.4 degrees Fahrenheit  Foul-smelling vaginal discharge  Difficulty urinating  Nausea and vomiting  Increased pain at the site of the incision that is unrelieved with pain medicine.  Difficulty breathing with or without chest pain  New calf pain especially if only on one  side  Sudden, continuing increased vaginal bleeding/drainage with or without clots.   Follow-up: 1. See Dr. Gerarda Fraction in 1-2 weeks.  Contacts: For questions or concerns you should contact:  Our office [phone] After hours and on week-ends for urgent issues relating to our treatment for you, please call [phone] and ask to speak to the physician on call for Gynecologic Oncology  We will not refill pain medications if we are not your primary provider or after hours. Plan accordingly if you are running low.      Post Anesthesia Home Care Instructions  Activity: Get plenty of rest for the remainder of the day. A responsible individual must stay with you for 24 hours following the procedure.  For the next 24 hours, DO NOT: -Drive a car -Paediatric nurse -Drink alcoholic beverages -Take any medication unless instructed by your physician -Make any legal decisions or sign important papers.  Meals: Start with liquid foods such as gelatin or soup. Progress to regular foods as tolerated. Avoid greasy, spicy, heavy foods. If nausea and/or vomiting occur, drink only clear liquids until the nausea and/or vomiting subsides. Call your physician if vomiting continues.  Special Instructions/Symptoms: Your throat may feel dry or sore from the anesthesia or the breathing tube placed in your throat during surgery. If this causes discomfort, gargle with warm salt water. The discomfort should disappear within 24 hours.  If you had a scopolamine patch placed behind your ear for the management of post- operative nausea and/or vomiting:  1. The medication in the patch is effective for 72 hours, after which it should be removed.  Wrap patch in a tissue and discard in the trash. Wash hands thoroughly with soap and water. 2. You may remove the patch earlier than 72 hours if you experience unpleasant side effects which may include dry mouth, dizziness or visual disturbances. 3. Avoid touching the  patch. Wash your hands with soap and water after contact with the patch.   NO IBUPROFEN PRODUCTS (MOTRIN, ADVIL) OR ALEVE UNTIL 3:15 PM TODAY.

## 2018-06-23 ENCOUNTER — Telehealth: Payer: Self-pay

## 2018-06-23 ENCOUNTER — Encounter (HOSPITAL_BASED_OUTPATIENT_CLINIC_OR_DEPARTMENT_OTHER): Payer: Self-pay | Admitting: Obstetrics

## 2018-06-23 LAB — CYTOLOGY - PAP: Diagnosis: NEGATIVE

## 2018-06-23 NOTE — Telephone Encounter (Signed)
L/M for patient stating that Dr. Gerarda Fraction states that she does not prescribe narcotics for a D&C which is what she had done yesterday. She can use Ibuprofen 600 mg q 6 hrs as needed or extra strength tylenol 1-2 tablets q 6 hours for pain. She can also try a heating pad. She can call the office back if she has any questions or concerns.

## 2018-06-26 ENCOUNTER — Telehealth: Payer: Self-pay

## 2018-06-26 NOTE — Telephone Encounter (Signed)
Attempted to contact patient with results.

## 2018-06-26 NOTE — Telephone Encounter (Signed)
Attempted to contact patient, 2 attempts, it rang a few times and stated you call can not be completed at this time, to try the call again later.

## 2018-06-26 NOTE — Telephone Encounter (Signed)
-----   Message from Antony Blackbird, MD sent at 06/24/2018 11:55 PM EDT ----- Notify patient of normal Pap smear

## 2018-06-26 NOTE — Telephone Encounter (Signed)
Told Shari Smith that her pap smear was normal. The surgical pathology was benign. No pre-cancer or cancer seen. She needs to keep follow up on 07-05-18 as scheduled. Dr. Gerarda Fraction will also discuss hysterectomy at this appointment. Pt verbalized understanding.

## 2018-07-02 DIAGNOSIS — Z79899 Other long term (current) drug therapy: Secondary | ICD-10-CM | POA: Diagnosis not present

## 2018-07-02 DIAGNOSIS — M5440 Lumbago with sciatica, unspecified side: Secondary | ICD-10-CM | POA: Diagnosis not present

## 2018-07-02 DIAGNOSIS — Z86018 Personal history of other benign neoplasm: Secondary | ICD-10-CM | POA: Diagnosis not present

## 2018-07-02 DIAGNOSIS — R109 Unspecified abdominal pain: Secondary | ICD-10-CM | POA: Diagnosis not present

## 2018-07-05 ENCOUNTER — Inpatient Hospital Stay: Payer: Medicaid Other | Attending: Obstetrics | Admitting: Obstetrics

## 2018-07-05 DIAGNOSIS — F3110 Bipolar disorder, current episode manic without psychotic features, unspecified: Secondary | ICD-10-CM | POA: Diagnosis not present

## 2018-07-05 DIAGNOSIS — F411 Generalized anxiety disorder: Secondary | ICD-10-CM | POA: Diagnosis not present

## 2018-07-30 DIAGNOSIS — Z79899 Other long term (current) drug therapy: Secondary | ICD-10-CM | POA: Diagnosis not present

## 2018-07-30 DIAGNOSIS — M5440 Lumbago with sciatica, unspecified side: Secondary | ICD-10-CM | POA: Diagnosis not present

## 2018-07-30 DIAGNOSIS — M545 Low back pain: Secondary | ICD-10-CM | POA: Diagnosis not present

## 2018-08-30 DIAGNOSIS — Z79899 Other long term (current) drug therapy: Secondary | ICD-10-CM | POA: Diagnosis not present

## 2018-09-12 ENCOUNTER — Ambulatory Visit: Payer: Medicaid Other | Admitting: Family Medicine

## 2018-09-13 ENCOUNTER — Ambulatory Visit: Payer: Medicaid Other | Admitting: Family Medicine

## 2018-09-27 DIAGNOSIS — M25562 Pain in left knee: Secondary | ICD-10-CM | POA: Diagnosis not present

## 2018-09-27 DIAGNOSIS — R1084 Generalized abdominal pain: Secondary | ICD-10-CM | POA: Diagnosis not present

## 2018-09-27 DIAGNOSIS — M25561 Pain in right knee: Secondary | ICD-10-CM | POA: Diagnosis not present

## 2018-09-27 DIAGNOSIS — G8929 Other chronic pain: Secondary | ICD-10-CM | POA: Diagnosis not present

## 2018-09-27 DIAGNOSIS — Z79899 Other long term (current) drug therapy: Secondary | ICD-10-CM | POA: Diagnosis not present

## 2018-09-27 DIAGNOSIS — M5440 Lumbago with sciatica, unspecified side: Secondary | ICD-10-CM | POA: Diagnosis not present

## 2018-10-18 ENCOUNTER — Telehealth: Payer: Self-pay | Admitting: *Deleted

## 2018-10-18 NOTE — Telephone Encounter (Signed)
Patient called to schedule an appt with Dr. Gerarda Fraction. Explained that she is no longer with the practice. Patient would to proceed with surgery, scheduled to see Dr. Fermin Schwab

## 2018-10-24 ENCOUNTER — Telehealth: Payer: Self-pay | Admitting: *Deleted

## 2018-10-24 NOTE — Telephone Encounter (Signed)
Called and moved the patient's appt from 3/10 with Dr. Fermin Schwab to 3/6 with Dr. Denman George

## 2018-10-25 DIAGNOSIS — Z79899 Other long term (current) drug therapy: Secondary | ICD-10-CM | POA: Diagnosis not present

## 2018-10-27 ENCOUNTER — Inpatient Hospital Stay: Payer: Medicaid Other | Attending: Gynecology | Admitting: Gynecologic Oncology

## 2018-10-27 ENCOUNTER — Encounter: Payer: Self-pay | Admitting: Gynecologic Oncology

## 2018-10-27 ENCOUNTER — Other Ambulatory Visit: Payer: Self-pay

## 2018-10-27 VITALS — BP 142/88 | HR 74 | Temp 97.9°F | Resp 18 | Ht 67.0 in | Wt 279.0 lb

## 2018-10-27 DIAGNOSIS — F17210 Nicotine dependence, cigarettes, uncomplicated: Secondary | ICD-10-CM

## 2018-10-27 DIAGNOSIS — D219 Benign neoplasm of connective and other soft tissue, unspecified: Secondary | ICD-10-CM | POA: Diagnosis not present

## 2018-10-27 DIAGNOSIS — N939 Abnormal uterine and vaginal bleeding, unspecified: Secondary | ICD-10-CM

## 2018-10-27 DIAGNOSIS — D259 Leiomyoma of uterus, unspecified: Secondary | ICD-10-CM | POA: Diagnosis present

## 2018-10-27 NOTE — Progress Notes (Signed)
[place] at [facility]   Follow-up Note: New Patient  Consult was requested by Dr. Autumn Messing for fibroids  Symptomatic uterine fibroids.  Assessment  Fibroids Abnormal Uterine Bleeding  Plan   I discussed with the patient that the treatment for symptomatic uterine fibroids includes:  1- progestin therapy with mirena IUD 2 - uterine artery embolization 3- hysterectomy.  I discussed that hysterectomy in an obese patient who is an active smoker is associated with a high risk of perioperative complications including bleeding, infection, damage to internal organs. I discussed that her prior surgeries including hernia repair with mesh place her at particularly high risk for having adhesive disease and this can significantly increase risk of visceral injury at the time of surgery including bowel injury and need for conversion to a laparotomy. If the procedure was performed robotically, she would likely need a minilaparotomy or vaginal morcellation for specimen delivery. Mini-lap would be associated with infection risk given her obesity and smoking history.  I discussed that I do not feel that her pain is secondary to her fibroids which have been present throughout her reproductive life, but the pain only became present after her last hernia surgery. Therefore it is not a reasonable goal of hysterectomy to expect her pain to decrease. However, it is reasonable to expect her bleeding symptoms would resolve with this and that alone may be worthwhile.  The patient became upset after hearing about surgical risks. She got dressed prior to having an exam performed, and wanted to leave. She stated that she didn't want surgery after hearing about these risks. I encouraged her to think about her options (including the less invasive options) and to reach out to me should she want to pursue these.   HPI: [name]  is a nice 48 y.o.  P4  In 2010 she had a  [place] cholecystectomy and umbilical hernia repair. This was complicated by a stitch in the transverse colon that was removed/oversewn. She states in 2017 she had another hernia repair this time with mesh (by Dr. Rosendo Gros).   She discussed some abdominal pain with her PCP and felt it was due to her hernia surgery (with mesh). She was sent on to General Surgery (Dr. Marlou Starks) who did not feel the pain was due to hernia and ordered followup imaging, noting that she had h/o fibroids on prior imaging.  Pain is generalized to the lower abdomen/pelvis by history today. Crampy, Also notes dysmenorrhea and back pain. Also notes painful periods which are heavy (using up to 3 pads layered at night) and can occur 2 times a month/irregularly. She states her periods have been like this for 2 years, but she has never discussed with her PCP or gone back to a Gyn since her Gyn (Dr. Ruthann Cancer) retired. She does think she had a Pap in the last year.  She denies prior knowledge of fibroids, but has been imaged as far back as 2012 where these were noted.  Of interest from a surgical standpoint she does have an enlarged liver. Chart documents h/o focal nodular hyperplasia of the liver.  Patient's goal is to be free of her chronic pain.  Interval Hx:  On 06/23/19 she underwent hysteroscopy and D&C. Pathology revealed benign squamous and endocervical mucosa, endometrial curettings revealed a benign endometrial polyp. Pap test was negative (no HPV testing obtained).   CT ab/pelvis on 06/15/19 showed Interval repair of umbilical hernia without recurrence. No specific explanation for abdominal pain.  Fibroid uterus. Left adrenal adenoma. Subtle liver masses known from 2015 abdominal MRI.   Radiology: No results found. .   Outpatient Encounter Medications as of 10/27/2018  Medication Sig  . amLODipine (NORVASC) 5 MG tablet Take 5 mg by mouth daily.  . diclofenac (VOLTAREN) 75 MG EC tablet TAKE 1 TABLET BY MOUTH TWICE DAILY  .  diclofenac sodium (VOLTAREN) 1 % GEL Apply 2 g topically 4 (four) times daily.  Marland Kitchen esomeprazole (NEXIUM) 20 MG capsule Take 20 mg by mouth daily at 12 noon.  . furosemide (LASIX) 20 MG tablet Take 20 mg by mouth daily.   Marland Kitchen oxyCODONE-acetaminophen (PERCOCET) 10-325 MG tablet TK 1 T PO QID PRN  . [DISCONTINUED] ALPRAZolam (XANAX) 1 MG tablet Take 1 mg by mouth 3 (three) times daily.  . [DISCONTINUED] HYDROcodone-acetaminophen (NORCO/VICODIN) 5-325 MG tablet Take 1 tablet by mouth every 6 (six) hours as needed for moderate pain.   No facility-administered encounter medications on file as of 10/27/2018.    Allergies  Allergen Reactions  . Lisinopril Swelling    Patient with lip swelling that might be associated with her use of lisinopril therefore this medication will be discontinued  . Strawberry Extract Hives    Strawberry ice cream allergy    Past Medical History:  Diagnosis Date  . 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 Anxiety   . Arthritis   . Fibroids   . Focal nodular hyperplasia of liver   . Hypertension   . Thyroid disease    Past Surgical History:  Procedure Laterality Date  . HYSTEROSCOPY W/D&C N/A 06/22/2018   Procedure: DILATATION AND CURETTAGE /HYSTEROSCOPY PAP SMEAR OF CERIX ENDO CERVICAL CURETTAGE;  Surgeon: [name], MD;  Location: [place];  Service: Gynecology;  Laterality: N/A;  . LAPAROSCOPIC CHOLECYSTECTOMY  2010  . TUBAL LIGATION    . VENTRAL HERNIA REPAIR  2017   with mesh per patient        Past Gynecological History:   GYNECOLOGIC HISTORY:  . No LMP recorded.  . Menarche: 48 years old . P 4 . Contraceptive tubal ligation . HRT NA  . Last Pap states 2019 ; waiting on results Family Hx:  Family History  Problem Relation Age of Onset  . Hypertension Mother   . Cancer Mother        stomach - deceased with disease  . Hypertension Father   . Breast cancer Sister 68  . Seizures Brother   . Hypertension Brother   .  Diabetes Brother   . Hypertension Brother   . Diabetes Sister    Social Hx:  Marland Kitchen Tobacco use: current every day . Alcohol use: few a year . Illicit Drug use: none . Illicit IV Drug use: none    Review of Systems: Review of Systems  Constitutional: Positive for appetite change and unexpected weight change.  HENT:   Positive for hearing loss.   Respiratory: Positive for cough and shortness of breath.   Cardiovascular: Positive for leg swelling.  Gastrointestinal: Positive for abdominal pain and constipation.  Endocrine: Positive for hot flashes.  Genitourinary: Positive for dyspareunia, menstrual problem and pelvic pain.   Musculoskeletal: Positive for arthralgias, back pain and myalgias.  Neurological: Positive for headaches.  Hematological: Bruises/bleeds easily.  Psychiatric/Behavioral: Positive for depression. The patient is nervous/anxious.   All other systems reviewed and are negative.   Vitals:  Vitals:   10/27/18 0923 10/27/18 1009  BP: (!) 141/95 (!) 142/88  Pulse: 74  Resp: 18   Temp: 97.9 F (36.6 C)   SpO2: 98%    Vitals:   10/27/18 0923  Weight: 279 lb (126.6 kg)  Height: 5\' 7"  (1.702 m)   Body mass index is 43.7 kg/m.  Physical Exam: General :  Obese. Well developed, 48 y.o., female in no apparent distress HEENT:  Normocephalic/atraumatic, symmetric, EOMI, eyelids normal Neck:   Supple, no masses.  Lymphatics:  No cervical/ submandibular/ supraclavicular/ infraclavicular/ inguinal adenopathy Respiratory:  Respirations unlabored, no use of accessory muscles CV:   Deferred Breast:  Deferred Musculoskeletal: No CVA tenderness, normal muscle strength. Abdomen:  Obese. Umbilicus with prior scar prominent Soft, non-tender and nondistended. No evidence of hernia. No masses. Extremities:  No lymphedema, no erythema, non-tender. Skin:   Normal inspection Neuro/Psych:  No focal motor deficit, no abnormal mental status. Normal gait. Normal affect. Alert and  oriented to person, place, and time  Genito Urinary: deferred at patient request.   Thereasa Solo, MD Gynecologic Oncologist 10/27/2018, 4:51 PM    Cc: Johny Chess, MD (Referring Gen Surgery) Antony Blackbird, MD (PCP)

## 2018-10-27 NOTE — Patient Instructions (Signed)
Options for treatment of your fibroids include:  1/ placement of an intrauterine device that releases progesterone and stops cycles 2/ uterine artery embolization in which the blood flow to the uterus is blocked with beads so that the fibroids shrink 3/ hysterectomy. The risks of hysterectomy (removal of the uterus and cervix) includes damage to the intestines and bladder, bleeding, infection, blood clot and problems with the wound healing. The factors which increase your risk for these complications include your smoking history and carrying extra weight around the middle section.  Dr Denman George is happy to see you back to discuss your options further. Her office can be reached at [phone]. Please contact her if you would like to move forward with any of these options to manage your fibroids.

## 2018-10-31 ENCOUNTER — Ambulatory Visit: Payer: Medicaid Other | Admitting: Gynecology

## 2018-11-23 DIAGNOSIS — Z9189 Other specified personal risk factors, not elsewhere classified: Secondary | ICD-10-CM | POA: Diagnosis not present

## 2018-11-23 DIAGNOSIS — G4700 Insomnia, unspecified: Secondary | ICD-10-CM | POA: Diagnosis not present

## 2018-11-23 DIAGNOSIS — M5440 Lumbago with sciatica, unspecified side: Secondary | ICD-10-CM | POA: Diagnosis not present

## 2018-12-13 DIAGNOSIS — F331 Major depressive disorder, recurrent, moderate: Secondary | ICD-10-CM | POA: Diagnosis not present

## 2018-12-20 DIAGNOSIS — F331 Major depressive disorder, recurrent, moderate: Secondary | ICD-10-CM | POA: Diagnosis not present

## 2018-12-22 DIAGNOSIS — M5440 Lumbago with sciatica, unspecified side: Secondary | ICD-10-CM | POA: Diagnosis not present

## 2018-12-22 DIAGNOSIS — Z79891 Long term (current) use of opiate analgesic: Secondary | ICD-10-CM | POA: Diagnosis not present

## 2018-12-22 DIAGNOSIS — Z79899 Other long term (current) drug therapy: Secondary | ICD-10-CM | POA: Diagnosis not present

## 2019-01-08 DIAGNOSIS — F331 Major depressive disorder, recurrent, moderate: Secondary | ICD-10-CM | POA: Diagnosis not present

## 2019-01-23 DIAGNOSIS — Z79891 Long term (current) use of opiate analgesic: Secondary | ICD-10-CM | POA: Diagnosis not present

## 2019-02-07 DIAGNOSIS — F331 Major depressive disorder, recurrent, moderate: Secondary | ICD-10-CM | POA: Diagnosis not present

## 2019-02-20 DIAGNOSIS — E559 Vitamin D deficiency, unspecified: Secondary | ICD-10-CM | POA: Diagnosis not present

## 2019-02-20 DIAGNOSIS — Z0000 Encounter for general adult medical examination without abnormal findings: Secondary | ICD-10-CM | POA: Diagnosis not present

## 2019-02-20 DIAGNOSIS — Z79899 Other long term (current) drug therapy: Secondary | ICD-10-CM | POA: Diagnosis not present

## 2019-02-20 DIAGNOSIS — M545 Low back pain: Secondary | ICD-10-CM | POA: Diagnosis not present

## 2019-02-20 DIAGNOSIS — R5383 Other fatigue: Secondary | ICD-10-CM | POA: Diagnosis not present

## 2019-02-21 ENCOUNTER — Other Ambulatory Visit (INDEPENDENT_AMBULATORY_CARE_PROVIDER_SITE_OTHER): Payer: Self-pay | Admitting: Orthopaedic Surgery

## 2019-02-22 NOTE — Telephone Encounter (Signed)
Ok to do

## 2019-02-22 NOTE — Telephone Encounter (Signed)
Ok to rf? 

## 2019-03-19 DIAGNOSIS — Z79891 Long term (current) use of opiate analgesic: Secondary | ICD-10-CM | POA: Diagnosis not present

## 2019-03-19 DIAGNOSIS — B369 Superficial mycosis, unspecified: Secondary | ICD-10-CM | POA: Diagnosis not present

## 2019-03-19 DIAGNOSIS — F5101 Primary insomnia: Secondary | ICD-10-CM | POA: Diagnosis not present

## 2019-03-19 DIAGNOSIS — M545 Low back pain: Secondary | ICD-10-CM | POA: Diagnosis not present

## 2019-03-19 DIAGNOSIS — Z79899 Other long term (current) drug therapy: Secondary | ICD-10-CM | POA: Diagnosis not present

## 2019-03-28 DIAGNOSIS — F331 Major depressive disorder, recurrent, moderate: Secondary | ICD-10-CM | POA: Diagnosis not present

## 2019-03-29 DIAGNOSIS — R1084 Generalized abdominal pain: Secondary | ICD-10-CM | POA: Diagnosis not present

## 2019-03-30 DIAGNOSIS — R1084 Generalized abdominal pain: Secondary | ICD-10-CM | POA: Diagnosis not present

## 2019-03-30 DIAGNOSIS — R300 Dysuria: Secondary | ICD-10-CM | POA: Diagnosis not present

## 2019-03-30 DIAGNOSIS — N12 Tubulo-interstitial nephritis, not specified as acute or chronic: Secondary | ICD-10-CM | POA: Diagnosis not present

## 2019-03-30 DIAGNOSIS — D259 Leiomyoma of uterus, unspecified: Secondary | ICD-10-CM | POA: Diagnosis not present

## 2019-04-02 DIAGNOSIS — N911 Secondary amenorrhea: Secondary | ICD-10-CM | POA: Diagnosis not present

## 2019-04-02 DIAGNOSIS — R1084 Generalized abdominal pain: Secondary | ICD-10-CM | POA: Diagnosis not present

## 2019-04-02 DIAGNOSIS — R3129 Other microscopic hematuria: Secondary | ICD-10-CM | POA: Diagnosis not present

## 2019-04-02 DIAGNOSIS — N23 Unspecified renal colic: Secondary | ICD-10-CM | POA: Diagnosis not present

## 2019-04-04 DIAGNOSIS — F331 Major depressive disorder, recurrent, moderate: Secondary | ICD-10-CM | POA: Diagnosis not present

## 2019-04-13 DIAGNOSIS — D259 Leiomyoma of uterus, unspecified: Secondary | ICD-10-CM | POA: Diagnosis not present

## 2019-04-13 DIAGNOSIS — N23 Unspecified renal colic: Secondary | ICD-10-CM | POA: Diagnosis not present

## 2019-04-16 DIAGNOSIS — M545 Low back pain: Secondary | ICD-10-CM | POA: Diagnosis not present

## 2019-04-16 DIAGNOSIS — Z79899 Other long term (current) drug therapy: Secondary | ICD-10-CM | POA: Diagnosis not present

## 2019-04-16 DIAGNOSIS — Z79891 Long term (current) use of opiate analgesic: Secondary | ICD-10-CM | POA: Diagnosis not present

## 2019-04-16 DIAGNOSIS — N921 Excessive and frequent menstruation with irregular cycle: Secondary | ICD-10-CM | POA: Diagnosis not present

## 2019-05-15 DIAGNOSIS — Z79891 Long term (current) use of opiate analgesic: Secondary | ICD-10-CM | POA: Diagnosis not present

## 2019-06-07 ENCOUNTER — Encounter: Payer: Self-pay | Admitting: *Deleted

## 2019-06-11 ENCOUNTER — Ambulatory Visit (INDEPENDENT_AMBULATORY_CARE_PROVIDER_SITE_OTHER): Payer: Medicaid Other | Admitting: Obstetrics and Gynecology

## 2019-06-11 ENCOUNTER — Other Ambulatory Visit: Payer: Self-pay

## 2019-06-11 ENCOUNTER — Encounter: Payer: Self-pay | Admitting: Obstetrics and Gynecology

## 2019-06-11 VITALS — BP 113/82 | HR 68 | Ht 66.0 in | Wt 296.7 lb

## 2019-06-11 DIAGNOSIS — D219 Benign neoplasm of connective and other soft tissue, unspecified: Secondary | ICD-10-CM | POA: Diagnosis not present

## 2019-06-11 DIAGNOSIS — N939 Abnormal uterine and vaginal bleeding, unspecified: Secondary | ICD-10-CM

## 2019-06-11 NOTE — Progress Notes (Signed)
Ms Zaya presents in referral from [facility] for uterine fibroids and AUB. Pt has already had an extensive work up for her AUB including nl EMBX.  Pt reports cycles are heavy, with cramps and clots. Last 7-10 days Pelvic pain increased with cycles but has some degree of daily pain. H/O chronic pain and chronic opoid management with [facility].   Last pap normal.  H/O hernia repair with mesh. H/O chronic pain and HTN, managed per PCP  H/O TSVD x 4 (largest 8#)  PE AF VSS Lungs clear Heart RRR Abd soft + BS obese GU menses noted, uterus < 12 weeks mobile no masses or tenderness, limited by pt habitus  A/P Uterine Fibroids        AUB        Pelvic pain  Discussed with pt. Pt desires hyst. Pt informed that doubt her pain is related to her uterine fibroids. That surgery will completely eliminate her pain but it would resolve her bleeding issues. Pt verbalized understanding. Will check GYN U/S. With H/O TSVD x 4 pt is a good [facility] candidate. R/B/Post op care reviewed with pt. Will schedule once U/S completed. F/U with post op

## 2019-06-11 NOTE — Patient Instructions (Signed)
Vaginal Hysterectomy, Care After Refer to this sheet in the next few weeks. These instructions provide you with information about caring for yourself after your procedure. Your health care provider may also give you more specific instructions. Your treatment has been planned according to current medical practices, but problems sometimes occur. Call your health care provider if you have any problems or questions after your procedure. What can I expect after the procedure? After the procedure, it is common to have:  Pain.  Soreness and numbness in your incision areas.  Vaginal bleeding and discharge.  Constipation.  Temporary problems emptying the bladder.  Feelings of sadness or other emotions. Follow these instructions at home: Medicines  Take over-the-counter and prescription medicines only as told by your health care provider.  If you were prescribed an antibiotic medicine, take it as told by your health care provider. Do not stop taking the antibiotic even if you start to feel better.  Do not drive or operate heavy machinery while taking prescription pain medicine. Activity  Return to your normal activities as told by your health care provider. Ask your health care provider what activities are safe for you.  Get regular exercise as told by your health care provider. You may be told to take short walks every day and go farther each time.  Do not lift anything that is heavier than 10 lb (4.5 kg). General instructions   Do not put anything in your vagina for 6 weeks after your surgery or as told by your health care provider. This includes tampons and douches.  Do not have sex until your health care provider says you can.  Do not take baths, swim, or use a hot tub until your health care provider approves.  Drink enough fluid to keep your urine clear or pale yellow.  Do not drive for 24 hours if you were given a sedative.  Keep all follow-up visits as told by your health  care provider. This is important. Contact a health care provider if:  Your pain medicine is not helping.  You have a fever.  You have redness, swelling, or pain at your incision site.  You have blood, pus, or a bad-smelling discharge from your vagina.  You continue to have difficulty urinating. Get help right away if:  You have severe abdominal or back pain.  You have heavy bleeding from your vagina.  You have chest pain or shortness of breath. This information is not intended to replace advice given to you by your health care provider. Make sure you discuss any questions you have with your health care provider. Document Released: 12/01/2015 Document Revised: 04/01/2016 Document Reviewed: 08/24/2015 Elsevier Patient Education  2020 [employer]. Vaginal Hysterectomy  A vaginal hysterectomy is a procedure to remove all or part of the uterus through a small incision in the vagina. In this procedure, your health care provider may remove your entire uterus, including the lower end (cervix). You may need a vaginal hysterectomy to treat:  Uterine fibroids.  A condition that causes the lining of the uterus to grow in other areas (endometriosis).  Problems with pelvic support.  Cancer of the cervix, ovaries, uterus, or tissue that lines the uterus (endometrium).  Excessive (dysfunctional) uterine bleeding. When removing your uterus, your health care provider may also remove the organs that produce eggs (ovaries) and the tubes that carry eggs to your uterus (fallopian tubes). After a vaginal hysterectomy, you will no longer be able to have a baby. You will also no  longer get your menstrual period. Tell a health care provider about:  Any allergies you have.  All medicines you are taking, including vitamins, herbs, eye drops, creams, and over-the-counter medicines.  Any problems you or family members have had with anesthetic medicines.  Any blood disorders you have.  Any  surgeries you have had.  Any medical conditions you have.  Whether you are pregnant or may be pregnant. What are the risks? Generally, this is a safe procedure. However, problems may occur, including:  Bleeding.  Infection.  A blood clot that forms in your leg and travels to your lungs (pulmonary embolism).  Damage to surrounding organs.  Pain during sex. What happens before the procedure?  Ask your health care provider what organs will be removed during surgery.  Ask your health care provider about: ? Changing or stopping your regular medicines. This is especially important if you are taking diabetes medicines or blood thinners. ? Taking medicines such as aspirin and ibuprofen. These medicines can thin your blood. Do not take these medicines before your procedure if your health care provider instructs you not to.  Follow instructions from your health care provider about eating or drinking restrictions.  Do not use any tobacco products, such as cigarettes, chewing tobacco, and e-cigarettes. If you need help quitting, ask your health care provider.  Plan to have someone take you home after discharge from the hospital. What happens during the procedure?  To reduce your risk of infection: ? Your health care team will wash or sanitize their hands. ? Your skin will be washed with soap.  An IV tube will be inserted into one of your veins.  You may be given antibiotic medicine to help prevent infection.  You will be given one or more of the following: ? A medicine to help you relax (sedative). ? A medicine to numb the area (local anesthetic). ? A medicine to make you fall asleep (general anesthetic). ? A medicine that is injected into an area of your body to numb everything beyond the injection site (regional anesthetic).  Your surgeon will make an incision in your vagina.  Your surgeon will locate and remove all or part of your uterus.  Your ovaries and fallopian tubes may  be removed at the same time.  The incision will be closed with stitches (sutures) that dissolve over time. The procedure may vary among health care providers and hospitals. What happens after the procedure?  Your blood pressure, heart rate, breathing rate, and blood oxygen level will be monitored often until the medicines you were given have worn off.  You will be encouraged to get up and walk around after a few hours to help prevent complications.  You may have IV tubes in place for a few days.  You will be given pain medicine as needed.  Do not drive for 24 hours if you were given a sedative. This information is not intended to replace advice given to you by your health care provider. Make sure you discuss any questions you have with your health care provider. Document Released: 12/01/2015 Document Revised: 04/01/2016 Document Reviewed: 08/24/2015 Elsevier Patient Education  2020 [employer].

## 2019-06-12 DIAGNOSIS — Z79899 Other long term (current) drug therapy: Secondary | ICD-10-CM | POA: Diagnosis not present

## 2019-07-05 DIAGNOSIS — Z5181 Encounter for therapeutic drug level monitoring: Secondary | ICD-10-CM | POA: Diagnosis not present

## 2019-07-10 DIAGNOSIS — Z79899 Other long term (current) drug therapy: Secondary | ICD-10-CM | POA: Diagnosis not present

## 2019-07-10 DIAGNOSIS — M545 Low back pain: Secondary | ICD-10-CM | POA: Diagnosis not present

## 2019-07-10 DIAGNOSIS — R600 Localized edema: Secondary | ICD-10-CM | POA: Diagnosis not present

## 2019-07-11 DIAGNOSIS — Z5181 Encounter for therapeutic drug level monitoring: Secondary | ICD-10-CM | POA: Diagnosis not present

## 2019-07-18 DIAGNOSIS — Z5181 Encounter for therapeutic drug level monitoring: Secondary | ICD-10-CM | POA: Diagnosis not present

## 2019-08-07 DIAGNOSIS — Z79899 Other long term (current) drug therapy: Secondary | ICD-10-CM | POA: Diagnosis not present

## 2019-09-08 DIAGNOSIS — Z79899 Other long term (current) drug therapy: Secondary | ICD-10-CM | POA: Diagnosis not present

## 2019-09-20 ENCOUNTER — Ambulatory Visit: Payer: Self-pay | Attending: Internal Medicine

## 2019-10-07 DIAGNOSIS — Z79899 Other long term (current) drug therapy: Secondary | ICD-10-CM | POA: Diagnosis not present

## 2019-11-03 DIAGNOSIS — Z79899 Other long term (current) drug therapy: Secondary | ICD-10-CM | POA: Diagnosis not present

## 2019-11-20 ENCOUNTER — Other Ambulatory Visit: Payer: Self-pay | Admitting: Family Medicine

## 2019-12-04 DIAGNOSIS — Z79899 Other long term (current) drug therapy: Secondary | ICD-10-CM | POA: Diagnosis not present

## 2019-12-19 DIAGNOSIS — H524 Presbyopia: Secondary | ICD-10-CM | POA: Diagnosis not present

## 2020-01-01 DIAGNOSIS — Z79899 Other long term (current) drug therapy: Secondary | ICD-10-CM | POA: Diagnosis not present

## 2020-01-08 DIAGNOSIS — H5213 Myopia, bilateral: Secondary | ICD-10-CM | POA: Diagnosis not present

## 2020-01-29 DIAGNOSIS — Z79899 Other long term (current) drug therapy: Secondary | ICD-10-CM | POA: Diagnosis not present

## 2020-02-04 DIAGNOSIS — H5203 Hypermetropia, bilateral: Secondary | ICD-10-CM | POA: Diagnosis not present

## 2020-02-16 ENCOUNTER — Other Ambulatory Visit: Payer: Self-pay

## 2020-02-16 ENCOUNTER — Ambulatory Visit (HOSPITAL_COMMUNITY)
Admission: EM | Admit: 2020-02-16 | Discharge: 2020-02-16 | Disposition: A | Discharge status: Home / Self Care | Payer: Medicaid Other | Attending: Emergency Medicine | Admitting: Emergency Medicine

## 2020-02-16 ENCOUNTER — Encounter (HOSPITAL_COMMUNITY): Payer: Self-pay

## 2020-02-16 DIAGNOSIS — M1990 Unspecified osteoarthritis, unspecified site: Secondary | ICD-10-CM

## 2020-02-16 DIAGNOSIS — R609 Edema, unspecified: Secondary | ICD-10-CM | POA: Diagnosis not present

## 2020-02-16 MED ORDER — MELOXICAM 7.5 MG PO TABS: 7.5000 mg | ORAL_TABLET | Freq: Every day | ORAL | 0 refills | Status: AC

## 2020-02-16 MED ORDER — FUROSEMIDE 20 MG PO TABS: ORAL_TABLET | ORAL | 0 refills | Status: DC

## 2020-02-16 NOTE — Discharge Instructions (Signed)
Begin Lasix 20 mg daily for the next 4-5 days, if not seeing much improvement with 20 mg, may increase to 40 mg for a few days. Continue compression stockings and elevation of legs  May try Mobic daily with food for knee pain  Please follow-up if any symptoms not improving or worsening, developing increased redness pain or swelling especially one-sided

## 2020-02-16 NOTE — ED Triage Notes (Signed)
Pt  Present ankle, legs and feet swelling. Symptoms started several weeks ago. Pt states that she does elevated her legs but the swelling comes back with some pain in the back of her legs and knees

## 2020-02-17 NOTE — ED Provider Notes (Signed)
[facility]    CSN: [id number] Arrival date & time: 02/16/20  1611      History   Chief Complaint Chief Complaint  Patient presents with  . Leg Swelling  . Joint Swelling    ankles     HPI Shari Smith is a 49 y.o. female history of hypertension, presenting today for evaluation of bilateral leg swelling.  Patient reports for several weeks she has had increased swelling and pain to bilateral lower legs.  Has history of this and has been placed on a fluid pill but she denies use of Lasix currently.  She has been using compression stockings without relief.  Symptoms improved with elevation, but return after being on her legs.  She also reports pain in her knees and has known arthritis.   HPI  Past Medical History:  Diagnosis Date  . 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 Anxiety   . Arthritis   . Fibroids   . Focal nodular hyperplasia of liver   . Hypertension   . Thyroid disease     Patient Active Problem List   Diagnosis Date Noted  . Abnormal uterine bleeding   . Fibroids 06/06/2018  . Acute pain of left knee 03/30/2018  . 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 Focal nodular hyperplasia of liver 10/25/2014    Past Surgical History:  Procedure Laterality Date  . HYSTEROSCOPY WITH D & C N/A 06/22/2018   Procedure: DILATATION AND CURETTAGE /HYSTEROSCOPY PAP SMEAR OF CERIX ENDO CERVICAL CURETTAGE;  Surgeon: [name], MD;  Location: [facility];  Service: Gynecology;  Laterality: N/A;  . LAPAROSCOPIC CHOLECYSTECTOMY  2010  . TUBAL LIGATION    . VENTRAL HERNIA REPAIR  2017   with mesh per patient    OB History   No obstetric history on file.      Home Medications    Prior to Admission medications   Medication Sig Start Date End Date Taking? Authorizing Provider  amLODipine (NORVASC) 5 MG tablet Take 5 mg by mouth daily.    [provider]  diclofenac (VOLTAREN) 75 MG EC tablet TAKE 1 TABLET BY MOUTH TWICE  DAILY Patient not taking: Reported on 06/11/2019 02/22/19   Aundra Dubin, PA-C  diclofenac sodium (VOLTAREN) 1 % GEL Apply 2 g topically 4 (four) times daily. Patient not taking: Reported on 06/11/2019 04/04/18   Aundra Dubin, PA-C  esomeprazole (NEXIUM) 20 MG capsule Take 20 mg by mouth daily at 12 noon.    [provider]  furosemide (LASIX) 20 MG tablet Take 20 mg of lasix daily for the next 5 days, may increase to 40 mg temporarily for 3-4 days 02/16/20   Temperance Kelemen C, PA-C  meloxicam (MOBIC) 7.5 MG tablet Take 1 tablet (7.5 mg total) by mouth daily for 7 days. Take in the morning, with food. 02/16/20 02/23/20  Khalik Pewitt C, PA-C  oxyCODONE-acetaminophen (PERCOCET) 10-325 MG tablet TK 1 T PO QID PRN 09/27/18   [provider]    Family History Family History  Problem Relation Age of Onset  . Hypertension Mother   . Cancer Mother        stomach - deceased with disease  . Hypertension Father   . Breast cancer Sister 65  . Seizures Brother   . Hypertension Brother   . Diabetes Brother   . Hypertension Brother   . Diabetes Sister     Social History Social History   Tobacco  Use  . Smoking status: Current Every Day Smoker    Packs/day: 0.25    Types: Cigarettes  . Smokeless tobacco: Never Used  . Tobacco comment: Patient states she smokes 4 cigarettes a day  Vaping Use  . Vaping Use: Never used  Substance Use Topics  . Alcohol use: Yes    Comment: few times a year  . Drug use: No     Allergies   Lisinopril and Strawberry extract   Review of Systems Review of Systems  Constitutional: Negative for fatigue and fever.  Eyes: Negative for visual disturbance.  Respiratory: Negative for shortness of breath.   Cardiovascular: Positive for leg swelling. Negative for chest pain.  Gastrointestinal: Negative for abdominal pain, nausea and vomiting.  Musculoskeletal: Negative for arthralgias and joint swelling.  Skin: Negative for color change, rash  and wound.  Neurological: Negative for dizziness, weakness, light-headedness and headaches.     Physical Exam Triage Vital Signs ED Triage Vitals  Enc Vitals Group     BP 02/16/20 1642 (!) 141/79     Pulse Rate 02/16/20 1642 73     Resp 02/16/20 1642 18     Temp 02/16/20 1642 98.7 F (37.1 C)     Temp Source 02/16/20 1642 Oral     SpO2 02/16/20 1642 98 %     Weight --      Height --      Head Circumference --      Peak Flow --      Pain Score 02/16/20 1640 10     Pain Loc --      Pain Edu? --      Excl. in [place]? --    No data found.  Updated Vital Signs BP (!) 141/79 (BP Location: Left Arm)   Pulse 73   Temp 98.7 F (37.1 C) (Oral)   Resp 18   LMP 01/24/2020   SpO2 98%   Visual Acuity Right Eye Distance:   Left Eye Distance:   Bilateral Distance:    Right Eye Near:   Left Eye Near:    Bilateral Near:     Physical Exam Vitals and nursing note reviewed.  Constitutional:      Appearance: She is well-developed.     Comments: No acute distress  HENT:     Head: Normocephalic and atraumatic.     Nose: Nose normal.  Eyes:     Conjunctiva/sclera: Conjunctivae normal.  Cardiovascular:     Rate and Rhythm: Normal rate.  Pulmonary:     Effort: Pulmonary effort is normal. No respiratory distress.  Abdominal:     General: There is no distension.  Musculoskeletal:        General: Normal range of motion.     Cervical back: Neck supple.     Comments: Bilateral lower extremities with 2+ pitting edema extending to proximal anterior shin  Lower leg symmetric, no overlying erythema, no calf tenderness bilaterally  Dorsalis pedis 2+ bilaterally  Skin:    General: Skin is warm and dry.  Neurological:     Mental Status: She is alert and oriented to person, place, and time.      UC Treatments / Results  Labs (all labs ordered are listed, but only abnormal results are displayed) Labs Reviewed - No data to display  EKG   Radiology No results  found.  Procedures Procedures (including critical care time)  Medications Ordered in UC Medications - No data to display  Initial Impression / Assessment and Plan /  UC Course  I have reviewed the triage vital signs and the nursing notes.  Pertinent labs & imaging results that were available during my care of the patient were reviewed by me and considered in my medical decision making (see chart for details).     Bilateral lower leg edema, given bilateral nature no overlying erythema, no calf tenderness, less suspicious of DVT.  Likely dependent edema.  Will place on Lasix x5 days, denies history of kidney disease.  Providing Mobic for arthritis.  Continue elevation and compression stockings.  Discussed strict return precautions. Patient verbalized understanding and is agreeable with plan.  Final Clinical Impressions(s) / UC Diagnoses   Final diagnoses:  Peripheral edema  Arthritis     Discharge Instructions     Begin Lasix 20 mg daily for the next 4-5 days, if not seeing much improvement with 20 mg, may increase to 40 mg for a few days. Continue compression stockings and elevation of legs  May try Mobic daily with food for knee pain  Please follow-up if any symptoms not improving or worsening, developing increased redness pain or swelling especially one-sided   ED Prescriptions    Medication Sig Dispense Auth. Provider   furosemide (LASIX) 20 MG tablet Take 20 mg of lasix daily for the next 5 days, may increase to 40 mg temporarily for 3-4 days 7 tablet Sugar Vanzandt C, PA-C   meloxicam (MOBIC) 7.5 MG tablet Take 1 tablet (7.5 mg total) by mouth daily for 7 days. Take in the morning, with food. 7 tablet Patrcia Schnepp, [place] C, PA-C     PDMP not reviewed this encounter.   Janith Lima, PA-C 02/17/20 1146

## 2020-02-26 ENCOUNTER — Other Ambulatory Visit (INDEPENDENT_AMBULATORY_CARE_PROVIDER_SITE_OTHER): Payer: Self-pay | Admitting: Physician Assistant

## 2020-02-26 DIAGNOSIS — Z79899 Other long term (current) drug therapy: Secondary | ICD-10-CM | POA: Diagnosis not present

## 2020-03-25 DIAGNOSIS — R300 Dysuria: Secondary | ICD-10-CM | POA: Diagnosis not present

## 2020-03-25 DIAGNOSIS — N911 Secondary amenorrhea: Secondary | ICD-10-CM | POA: Diagnosis not present

## 2020-03-25 DIAGNOSIS — M545 Low back pain: Secondary | ICD-10-CM | POA: Diagnosis not present

## 2020-03-25 DIAGNOSIS — R82998 Other abnormal findings in urine: Secondary | ICD-10-CM | POA: Diagnosis not present

## 2020-03-25 DIAGNOSIS — Z79899 Other long term (current) drug therapy: Secondary | ICD-10-CM | POA: Diagnosis not present

## 2020-04-22 DIAGNOSIS — R5383 Other fatigue: Secondary | ICD-10-CM | POA: Diagnosis not present

## 2020-04-22 DIAGNOSIS — Z1159 Encounter for screening for other viral diseases: Secondary | ICD-10-CM | POA: Diagnosis not present

## 2020-04-22 DIAGNOSIS — E559 Vitamin D deficiency, unspecified: Secondary | ICD-10-CM | POA: Diagnosis not present

## 2020-04-22 DIAGNOSIS — Z0000 Encounter for general adult medical examination without abnormal findings: Secondary | ICD-10-CM | POA: Diagnosis not present

## 2020-04-22 DIAGNOSIS — M129 Arthropathy, unspecified: Secondary | ICD-10-CM | POA: Diagnosis not present

## 2020-04-22 DIAGNOSIS — Z79899 Other long term (current) drug therapy: Secondary | ICD-10-CM | POA: Diagnosis not present

## 2020-04-22 DIAGNOSIS — M545 Low back pain: Secondary | ICD-10-CM | POA: Diagnosis not present

## 2020-04-29 ENCOUNTER — Other Ambulatory Visit: Payer: Self-pay | Admitting: Physician Assistant

## 2020-04-29 DIAGNOSIS — M8580 Other specified disorders of bone density and structure, unspecified site: Secondary | ICD-10-CM

## 2020-04-29 DIAGNOSIS — Z1231 Encounter for screening mammogram for malignant neoplasm of breast: Secondary | ICD-10-CM

## 2020-05-01 ENCOUNTER — Ambulatory Visit: Payer: Medicaid Other

## 2020-05-12 ENCOUNTER — Encounter: Payer: Self-pay | Admitting: Obstetrics and Gynecology

## 2020-05-12 ENCOUNTER — Other Ambulatory Visit: Payer: Self-pay

## 2020-05-12 ENCOUNTER — Ambulatory Visit (INDEPENDENT_AMBULATORY_CARE_PROVIDER_SITE_OTHER): Payer: Medicaid Other | Admitting: Obstetrics and Gynecology

## 2020-05-12 VITALS — BP 125/77 | HR 67 | Ht 66.0 in | Wt 292.5 lb

## 2020-05-12 DIAGNOSIS — D219 Benign neoplasm of connective and other soft tissue, unspecified: Secondary | ICD-10-CM

## 2020-05-12 DIAGNOSIS — R102 Pelvic and perineal pain: Secondary | ICD-10-CM | POA: Diagnosis not present

## 2020-05-12 DIAGNOSIS — N939 Abnormal uterine and vaginal bleeding, unspecified: Secondary | ICD-10-CM | POA: Diagnosis not present

## 2020-05-12 NOTE — Progress Notes (Signed)
Pt seen in f/u form 10/20 appt. Did not have U/S Reports still heavy monthly painful cycles. Also abd pain while not on cycle. Has had extensive w/u. See prior notes H/O uterine fibroids H/O BTL. GB, hernia with mesh TSVD x 4 largest 8 # Pap last year normal  PE AF VSS Lungs clear Heart RRR Abd soft + BS obese GU nl EGBUS uterus @ 10-12 weeks mobile, non tender, exam limited by pt habitus  A/P Uterine fibroids        AUB        Pelvic pain        Dysmenorrhea  Pt still desires hyst. Will check GYN U/S. Indications for U/S reviewed with pt. R/B/Post op care from [facility]/BS reviewed with pt. Pt informed that pelvic pain may not be eliminated by hyst. Pt verbalized understanding. Will schedule surgery after U/S. Information on uterine fibroids, vag hyst and post op care provided to pt.

## 2020-05-12 NOTE — Patient Instructions (Signed)
Vaginal Hysterectomy, Care After Refer to this sheet in the next few weeks. These instructions provide you with information about caring for yourself after your procedure. Your health care provider may also give you more specific instructions. Your treatment has been planned according to current medical practices, but problems sometimes occur. Call your health care provider if you have any problems or questions after your procedure. What can I expect after the procedure? After the procedure, it is common to have:  Pain.  Soreness and numbness in your incision areas.  Vaginal bleeding and discharge.  Constipation.  Temporary problems emptying the bladder.  Feelings of sadness or other emotions. Follow these instructions at home: Medicines  Take over-the-counter and prescription medicines only as told by your health care provider.  If you were prescribed an antibiotic medicine, take it as told by your health care provider. Do not stop taking the antibiotic even if you start to feel better.  Do not drive or operate heavy machinery while taking prescription pain medicine. Activity  Return to your normal activities as told by your health care provider. Ask your health care provider what activities are safe for you.  Get regular exercise as told by your health care provider. You may be told to take short walks every day and go farther each time.  Do not lift anything that is heavier than 10 lb (4.5 kg). General instructions   Do not put anything in your vagina for 6 weeks after your surgery or as told by your health care provider. This includes tampons and douches.  Do not have sex until your health care provider says you can.  Do not take baths, swim, or use a hot tub until your health care provider approves.  Drink enough fluid to keep your urine clear or pale yellow.  Do not drive for 24 hours if you were given a sedative.  Keep all follow-up visits as told by your health  care provider. This is important. Contact a health care provider if:  Your pain medicine is not helping.  You have a fever.  You have redness, swelling, or pain at your incision site.  You have blood, pus, or a bad-smelling discharge from your vagina.  You continue to have difficulty urinating. Get help right away if:  You have severe abdominal or back pain.  You have heavy bleeding from your vagina.  You have chest pain or shortness of breath. This information is not intended to replace advice given to you by your health care provider. Make sure you discuss any questions you have with your health care provider. Document Revised: 04/01/2016 Document Reviewed: 08/24/2015 Elsevier Patient Education  2020 [place]. Vaginal Hysterectomy  A vaginal hysterectomy is a procedure to remove all or part of the uterus through a small incision in the vagina. In this procedure, your health care provider may remove your entire uterus, including the lower end (cervix). You may need a vaginal hysterectomy to treat:  Uterine fibroids.  A condition that causes the lining of the uterus to grow in other areas (endometriosis).  Problems with pelvic support.  Cancer of the cervix, ovaries, uterus, or tissue that lines the uterus (endometrium).  Excessive (dysfunctional) uterine bleeding. When removing your uterus, your health care provider may also remove the organs that produce eggs (ovaries) and the tubes that carry eggs to your uterus (fallopian tubes). After a vaginal hysterectomy, you will no longer be able to have a baby. You will also no longer get your  menstrual period. Tell a health care provider about:  Any allergies you have.  All medicines you are taking, including vitamins, herbs, eye drops, creams, and over-the-counter medicines.  Any problems you or family members have had with anesthetic medicines.  Any blood disorders you have.  Any surgeries you have had.  Any medical  conditions you have.  Whether you are pregnant or may be pregnant. What are the risks? Generally, this is a safe procedure. However, problems may occur, including:  Bleeding.  Infection.  A blood clot that forms in your leg and travels to your lungs (pulmonary embolism).  Damage to surrounding organs.  Pain during sex. What happens before the procedure?  Ask your health care provider what organs will be removed during surgery.  Ask your health care provider about: ? Changing or stopping your regular medicines. This is especially important if you are taking diabetes medicines or blood thinners. ? Taking medicines such as aspirin and ibuprofen. These medicines can thin your blood. Do not take these medicines before your procedure if your health care provider instructs you not to.  Follow instructions from your health care provider about eating or drinking restrictions.  Do not use any tobacco products, such as cigarettes, chewing tobacco, and e-cigarettes. If you need help quitting, ask your health care provider.  Plan to have someone take you home after discharge from the hospital. What happens during the procedure?  To reduce your risk of infection: ? Your health care team will wash or sanitize their hands. ? Your skin will be washed with soap.  An IV tube will be inserted into one of your veins.  You may be given antibiotic medicine to help prevent infection.  You will be given one or more of the following: ? A medicine to help you relax (sedative). ? A medicine to numb the area (local anesthetic). ? A medicine to make you fall asleep (general anesthetic). ? A medicine that is injected into an area of your body to numb everything beyond the injection site (regional anesthetic).  Your surgeon will make an incision in your vagina.  Your surgeon will locate and remove all or part of your uterus.  Your ovaries and fallopian tubes may be removed at the same time.  The  incision will be closed with stitches (sutures) that dissolve over time. The procedure may vary among health care providers and hospitals. What happens after the procedure?  Your blood pressure, heart rate, breathing rate, and blood oxygen level will be monitored often until the medicines you were given have worn off.  You will be encouraged to get up and walk around after a few hours to help prevent complications.  You may have IV tubes in place for a few days.  You will be given pain medicine as needed.  Do not drive for 24 hours if you were given a sedative. This information is not intended to replace advice given to you by your health care provider. Make sure you discuss any questions you have with your health care provider. Document Revised: 04/01/2016 Document Reviewed: 08/24/2015 Elsevier Patient Education  2020 [place]. Uterine Fibroids  Uterine fibroids (leiomyomas) are noncancerous (benign) tumors that can develop in the uterus. Fibroids may also develop in the fallopian tubes, cervix, or tissues (ligaments) near the uterus. You may have one or many fibroids. Fibroids vary in size, weight, and where they grow in the uterus. Some can become quite large. Most fibroids do not require medical treatment. What are  the causes? The cause of this condition is not known. What increases the risk? You are more likely to develop this condition if you:  Are in your 30s or 40s and have not gone through menopause.  Have a family history of this condition.  Are of African-American descent.  Had your first period at an early age (early menarche).  Have not had any children (nulliparity).  Are overweight or obese. What are the signs or symptoms? Many women do not have any symptoms. Symptoms of this condition may include:  Heavy menstrual bleeding.  Bleeding or spotting between periods.  Pain and pressure in the pelvic area, between the hips.  Bladder problems, such as  needing to urinate urgently or more often than usual.  Inability to have children (infertility).  Failure to carry pregnancy to term (miscarriage). How is this diagnosed? This condition may be diagnosed based on:  Your symptoms and medical history.  A physical exam.  A pelvic exam that includes feeling for any tumors.  Imaging tests, such as ultrasound or MRI. How is this treated? Treatment for this condition may include:  Seeing your health care provider for follow-up visits to monitor your fibroids for any changes.  Taking NSAIDs such as ibuprofen, naproxen, or aspirin to reduce pain.  Hormone medicines. These may be taken as a pill, given in an injection, or delivered by a T-shaped device that is inserted into the uterus (intrauterine device, IUD).  Surgery to remove one of the following: ? The fibroids (myomectomy). Your health care provider may recommend this if fibroids affect your fertility and you want to become pregnant. ? The uterus (hysterectomy). ? Blood supply to the fibroids (uterine artery embolization). Follow these instructions at home:  Take over-the-counter and prescription medicines only as told by your health care provider.  Ask your health care provider if you should take iron pills or eat more iron-rich foods, such as dark green, leafy vegetables. Heavy menstrual bleeding can cause low iron levels.  If directed, apply heat to your back or abdomen to reduce pain. Use the heat source that your health care provider recommends, such as a moist heat pack or a heating pad. ? Place a towel between your skin and the heat source. ? Leave the heat on for 20-30 minutes. ? Remove the heat if your skin turns bright red. This is especially important if you are unable to feel pain, heat, or cold. You may have a greater risk of getting burned.  Pay close attention to your menstrual cycle. Tell your health care provider about any changes, such as: ? Increased blood flow  that requires you to use more pads or tampons than usual. ? A change in the number of days that your period lasts. ? A change in symptoms that are associated with your period, such as back pain or cramps in your abdomen.  Keep all follow-up visits as told by your health care provider. This is important, especially if your fibroids need to be monitored for any changes. Contact a health care provider if you:  Have pelvic pain, back pain, or cramps in your abdomen that do not get better with medicine or heat.  Develop new bleeding between periods.  Have increased bleeding during or between periods.  Feel unusually tired or weak.  Feel light-headed. Get help right away if you:  Faint.  Have pelvic pain that suddenly gets worse.  Have severe vaginal bleeding that soaks a tampon or pad in 30 minutes or less.  Summary  Uterine fibroids are noncancerous (benign) tumors that can develop in the uterus.  The exact cause of this condition is not known.  Most fibroids do not require medical treatment unless they affect your ability to have children (fertility).  Contact a health care provider if you have pelvic pain, back pain, or cramps in your abdomen that do not get better with medicines.  Make sure you know what symptoms should cause you to get help right away. This information is not intended to replace advice given to you by your health care provider. Make sure you discuss any questions you have with your health care provider. Document Revised: 07/22/2017 Document Reviewed: 07/05/2017 Elsevier Patient Education  2020 [employer].

## 2020-05-14 ENCOUNTER — Ambulatory Visit: Payer: Medicaid Other

## 2020-05-15 ENCOUNTER — Other Ambulatory Visit: Payer: Self-pay

## 2020-05-15 ENCOUNTER — Ambulatory Visit
Admission: RE | Admit: 2020-05-15 | Discharge: 2020-05-15 | Disposition: A | Discharge status: Home / Self Care | Payer: Medicaid Other | Source: Ambulatory Visit | Attending: Physician Assistant | Admitting: Physician Assistant

## 2020-05-15 DIAGNOSIS — Z1231 Encounter for screening mammogram for malignant neoplasm of breast: Secondary | ICD-10-CM

## 2020-05-16 ENCOUNTER — Ambulatory Visit (HOSPITAL_COMMUNITY): Payer: Medicaid Other

## 2020-05-20 ENCOUNTER — Other Ambulatory Visit: Payer: Self-pay | Admitting: Physician Assistant

## 2020-05-20 DIAGNOSIS — R928 Other abnormal and inconclusive findings on diagnostic imaging of breast: Secondary | ICD-10-CM

## 2020-05-20 DIAGNOSIS — Z79899 Other long term (current) drug therapy: Secondary | ICD-10-CM | POA: Diagnosis not present

## 2020-05-21 ENCOUNTER — Ambulatory Visit (HOSPITAL_COMMUNITY): Admission: RE | Admit: 2020-05-21 | Payer: Medicaid Other | Source: Ambulatory Visit

## 2020-05-28 ENCOUNTER — Ambulatory Visit: Payer: Medicaid Other | Admitting: Obstetrics and Gynecology

## 2020-05-29 ENCOUNTER — Ambulatory Visit
Admission: RE | Admit: 2020-05-29 | Discharge: 2020-05-29 | Disposition: A | Discharge status: Home / Self Care | Payer: Medicaid Other | Source: Ambulatory Visit | Attending: Physician Assistant | Admitting: Physician Assistant

## 2020-05-29 ENCOUNTER — Other Ambulatory Visit: Payer: Self-pay | Admitting: Physician Assistant

## 2020-05-29 ENCOUNTER — Other Ambulatory Visit: Payer: Self-pay | Admitting: Family Medicine

## 2020-05-29 ENCOUNTER — Other Ambulatory Visit: Payer: Self-pay

## 2020-05-29 DIAGNOSIS — R928 Other abnormal and inconclusive findings on diagnostic imaging of breast: Secondary | ICD-10-CM

## 2020-05-29 DIAGNOSIS — N6310 Unspecified lump in the right breast, unspecified quadrant: Secondary | ICD-10-CM

## 2020-06-12 ENCOUNTER — Ambulatory Visit
Admission: RE | Admit: 2020-06-12 | Discharge: 2020-06-12 | Disposition: A | Discharge status: Home / Self Care | Payer: Medicaid Other | Source: Ambulatory Visit | Attending: Physician Assistant | Admitting: Physician Assistant

## 2020-06-12 ENCOUNTER — Other Ambulatory Visit: Payer: Self-pay

## 2020-06-12 ENCOUNTER — Other Ambulatory Visit: Payer: Self-pay | Admitting: Physician Assistant

## 2020-06-12 DIAGNOSIS — N6310 Unspecified lump in the right breast, unspecified quadrant: Secondary | ICD-10-CM

## 2020-06-12 DIAGNOSIS — R928 Other abnormal and inconclusive findings on diagnostic imaging of breast: Secondary | ICD-10-CM | POA: Diagnosis not present

## 2020-06-12 DIAGNOSIS — N6313 Unspecified lump in the right breast, lower outer quadrant: Secondary | ICD-10-CM | POA: Diagnosis not present

## 2020-06-12 DIAGNOSIS — D241 Benign neoplasm of right breast: Secondary | ICD-10-CM | POA: Diagnosis not present

## 2020-06-16 DIAGNOSIS — Z79899 Other long term (current) drug therapy: Secondary | ICD-10-CM | POA: Diagnosis not present

## 2020-07-04 ENCOUNTER — Ambulatory Visit: Payer: Self-pay | Admitting: Surgery

## 2020-07-04 DIAGNOSIS — Z9189 Other specified personal risk factors, not elsewhere classified: Secondary | ICD-10-CM | POA: Diagnosis not present

## 2020-07-04 DIAGNOSIS — N6310 Unspecified lump in the right breast, unspecified quadrant: Secondary | ICD-10-CM

## 2020-07-04 DIAGNOSIS — N6091 Unspecified benign mammary dysplasia of right breast: Secondary | ICD-10-CM | POA: Diagnosis not present

## 2020-07-04 NOTE — H&P (Signed)
Shari Smith Appointment: 07/04/2020 9:40 AM Location: [facility] Patient #: [id number] DOB: 06/20/1971 Single / Language: Shari Smith / Race: Black or African American Female  History of Present Illness Shari Moores A. Laurier Jasperson MD; 07/04/2020 12:38 PM) Patient words: Patient for evaluation of a right breast mass noted on recent screening mammogram. Core biopsy was done which shows atypical ductal hyperplasia and papilloma. She has a strong family history of breast cancer. Screening recall for right breast mass. Please see above denies chest pain, breast pain, breast mass, nipple discharge or change in either breast.  EXAM: DIGITAL DIAGNOSTIC UNILATERAL RIGHT MAMMOGRAM WITH TOMO AND CAD; ULTRASOUND RIGHT BREAST LIMITED  COMPARISON: Previous exams.  ACR Breast Density Category c: The breast tissue is heterogeneously dense, which may obscure small masses.  FINDINGS: Spot compression tomograms were performed over the central/retroareolar right breast. There is an oval circumscribed mass measuring approximately 0.8 cm.  Mammographic images were processed with CAD.  Targeted ultrasound of the right breast was performed. There is an oval mildly hypoechoic mass adjacent to a duct and possibly intraductal at 8 o'clock 2 cm from nipple. This measures 0.8 x 0.6 x 0.7 cm. This corresponds well with the mass seen in the right breast at mammography. No lymphadenopathy seen in the right axilla.  IMPRESSION: 0.8 cm likely intraductal mass in the right breast at the 8 o'clock position.  RECOMMENDATION: Recommend ultrasound-guided core biopsy of the mass in the right breast at the 8 o'clock position. This will be scheduled for the patient.  I have discussed the findings and recommendations with the patient. If applicable, a reminder letter will be sent to the patient regarding the next appointment.  BI-RADS CATEGORY 4: Suspicious.   Electronically Signed By: Shari Smith M.D. 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  The patient is a 49 year old female.   Allergies (Shari Smith, Smith; 07/04/2020 9:42 AM) Shari Smith Flavor *PHARMACEUTICAL ADJUVANTS* Hives. Naprosyn *ANALGESICS - ANTI-INFLAMMATORY* Allergies Reconciled  Medication History (Shari Smith, Smith; 07/04/2020 9:42 AM) amLODIPine Besylate (5MG  Tablet, Oral) Active. Meloxicam (7.5MG  Tablet, Oral) Active. Furosemide (20MG  Tablet, Oral) Active. Voltaren (1% Gel, Transdermal) Active. Lisinopril (20MG  Tablet, Oral) Active. NexIUM (40MG  Capsule DR, Oral) Active. Xanax (1MG  Tablet, Oral) Active. Medications Reconciled    Vitals (Shari Smith; 07/04/2020 9:43 AM) 07/04/2020 9:43 AM Weight: 299.5 lb Height: 66in Body Surface Area: 2.37 m Body Mass Index: 48.34 kg/m  Temp.: 97.73F  Pulse: 76 (Regular)  BP: 124/74(Sitting, Left Arm, Standard)        Physical Exam (Shari Witherington A. Charnell Peplinski MD; 07/04/2020 12:38 PM)  General Mental Status-Alert. General Appearance-Consistent with stated age. Hydration-Well hydrated. Voice-Normal.  Head and Neck Head-normocephalic, atraumatic with no lesions or palpable masses. Trachea-midline. Thyroid Gland Characteristics - normal size and consistency.  Eye Eyeball - Bilateral-Extraocular movements intact. Sclera/Conjunctiva - Bilateral-No scleral icterus.  Chest and Lung Exam Chest and lung exam reveals -quiet, even and easy respiratory effort with no use of accessory muscles and on auscultation, normal breath sounds, no adventitious sounds and normal vocal resonance. Inspection Chest Wall - Normal. Back - normal.  Breast Breast - Left-Symmetric, Non Tender, No Biopsy scars, no Dimpling - Left, No Inflammation, No Lumpectomy scars, No Mastectomy scars, No Peau d' Orange. Breast -  Right-Symmetric, Non Tender, No Biopsy scars, no Dimpling - Right, No Inflammation, No Lumpectomy scars, No Mastectomy scars, No Peau  d' Orange. Breast Lump-No Palpable Breast Mass.  Cardiovascular Cardiovascular examination reveals -normal heart sounds, regular rate and rhythm with no murmurs and normal pedal pulses bilaterally.  Abdomen Inspection Inspection of the abdomen reveals - No Hernias. Skin - Scar - no surgical scars. Palpation/Percussion Palpation and Percussion of the abdomen reveal - Soft, Non Tender, No Rebound tenderness, No Rigidity (guarding) and No hepatosplenomegaly. Auscultation Auscultation of the abdomen reveals - Bowel sounds normal.  Neurologic Neurologic evaluation reveals -alert and oriented x 3 with no impairment of recent or remote memory. Mental Status-Normal.  Musculoskeletal Normal Exam - Left-Upper Extremity Strength Normal and Lower Extremity Strength Normal. Normal Exam - Right-Upper Extremity Strength Normal and Lower Extremity Strength Normal.  Lymphatic Head & Neck  General Head & Neck Lymphatics: Bilateral - Description - Normal. Axillary  General Axillary Region: Bilateral - Description - Normal. Tenderness - Non Tender. Femoral & Inguinal  Generalized Femoral & Inguinal Lymphatics: Bilateral - Description - Normal. Tenderness - Non Tender.    Assessment & Plan (Shari Foskett A. Lydia Meng MD; 07/04/2020 12:39 PM)  ATYPICAL DUCTAL HYPERPLASIA OF RIGHT BREAST (N60.91) Impression: right breast seed lumpectomy due to potential goitrous Discussed nonoperative management Discussed complications of surgery She would like to proceed with surgery.  Risk of lumpectomy include bleeding, infection, seroma, more surgery, use of seed/wire, wound care, cosmetic deformity and the need for other treatments, death , blood clots, death. Pt agrees to proceed.  Current Plans Pt Education - [employer] Free Text Education/Instructions: discussed with  patient and provided information.  INCREASED RISK OF BREAST CANCER (Z91.89) Impression: 41 % lifetime TC model  genetics  Risk reducing strategies discussed. Yearly magnetic resonance imaging Genetic testing  Current Plans The anatomy and the physiology was discussed. The pathophysiology and natural history of the disease was discussed. Options were discussed and recommendations were made. Technique, risks, benefits, & alternatives were discussed. Risks such as stroke, heart attack, bleeding, indection, death, and other risks discussed. Questions answered. The patient agrees to proceed.

## 2020-07-08 ENCOUNTER — Ambulatory Visit (HOSPITAL_COMMUNITY)
Admission: RE | Admit: 2020-07-08 | Discharge: 2020-07-08 | Disposition: A | Discharge status: Home / Self Care | Payer: Medicaid Other | Source: Ambulatory Visit | Attending: Obstetrics and Gynecology | Admitting: Obstetrics and Gynecology

## 2020-07-08 ENCOUNTER — Other Ambulatory Visit: Payer: Self-pay

## 2020-07-08 DIAGNOSIS — N854 Malposition of uterus: Secondary | ICD-10-CM | POA: Diagnosis not present

## 2020-07-08 DIAGNOSIS — D219 Benign neoplasm of connective and other soft tissue, unspecified: Secondary | ICD-10-CM | POA: Diagnosis not present

## 2020-07-08 DIAGNOSIS — D251 Intramural leiomyoma of uterus: Secondary | ICD-10-CM | POA: Diagnosis not present

## 2020-07-08 DIAGNOSIS — N83291 Other ovarian cyst, right side: Secondary | ICD-10-CM | POA: Diagnosis not present

## 2020-07-10 ENCOUNTER — Telehealth: Payer: Self-pay | Admitting: Genetic Counselor

## 2020-07-10 ENCOUNTER — Telehealth: Payer: Self-pay | Admitting: Lactation Services

## 2020-07-10 NOTE — Telephone Encounter (Signed)
Called patient to inform her of [place] results and that she can sign her Hysterectomy statement when in the office tomorrow. Reviewed surgery Scheduler will be calling her to set up her surgery date. Patient to follow up in office tomorrow with Dr. Rip Harbour.

## 2020-07-10 NOTE — Telephone Encounter (Signed)
Received a genetic counseling referral from Dr. Brantley Stage for increased risk for breast cancer. [name] has been cld and scheduled to see Cari on 12/9 at 2pm. Pt aware to arrive 15 minutes early.

## 2020-07-10 NOTE — Telephone Encounter (Signed)
-----   Message from Chancy Milroy, MD sent at 07/10/2020  9:39 AM EST ----- Please let Ms Pinheiro know that her GYN U/S confirmed uterine fibroids. Slight increase in size since last evaluation Please have her come to office to sign hysterotomy papers if needed. Jacidina with be in touch with her surgery date.  Thanks Legrand Como

## 2020-07-11 ENCOUNTER — Ambulatory Visit (INDEPENDENT_AMBULATORY_CARE_PROVIDER_SITE_OTHER): Payer: Medicaid Other | Admitting: Obstetrics and Gynecology

## 2020-07-11 ENCOUNTER — Encounter: Payer: Self-pay | Admitting: Obstetrics and Gynecology

## 2020-07-11 ENCOUNTER — Other Ambulatory Visit: Payer: Self-pay

## 2020-07-11 VITALS — BP 115/81 | HR 58 | Ht 66.0 in | Wt 297.7 lb

## 2020-07-11 DIAGNOSIS — D219 Benign neoplasm of connective and other soft tissue, unspecified: Secondary | ICD-10-CM

## 2020-07-11 DIAGNOSIS — N939 Abnormal uterine and vaginal bleeding, unspecified: Secondary | ICD-10-CM

## 2020-07-11 DIAGNOSIS — R102 Pelvic and perineal pain: Secondary | ICD-10-CM

## 2020-07-11 NOTE — Progress Notes (Signed)
Shari Smith presents for pre op eval. See prior notes Pt scheduled for TVH/possible salpingectomy on 07/22/20 d/t uterine fibroids and pelvic pain.   PE AF VSS Lungs clear Heart RRR Abd soft + BS obses GU Nl EGBIS uterus  10-12 week size, mobile, no masses exam limited by pt habitus  A/P Uterine fibroids        Pelvic pain  Hysterectomy papers signed today. Surgery reviewed with pt. Pt informed surgery may not eliminate her pelvic pain. R/B/Post care reviewed. F/U with post op visit

## 2020-07-11 NOTE — Progress Notes (Signed)
Hysterectomy Statement signed-07/11/20

## 2020-07-11 NOTE — H&P (View-Only) (Signed)
Shari Smith presents for pre op eval. See prior notes Pt scheduled for TVH/possible salpingectomy on 07/22/20 d/t uterine fibroids and pelvic pain.   PE AF VSS Lungs clear Heart RRR Abd soft + BS obses GU Nl EGBIS uterus  10-12 week size, mobile, no masses exam limited by pt habitus  A/P Uterine fibroids        Pelvic pain  Hysterectomy papers signed today. Surgery reviewed with pt. Pt informed surgery may not eliminate her pelvic pain. R/B/Post care reviewed. F/U with post op visit

## 2020-07-11 NOTE — Patient Instructions (Signed)
Vaginal Hysterectomy, Care After Refer to this sheet in the next few weeks. These instructions provide you with information about caring for yourself after your procedure. Your health care provider may also give you more specific instructions. Your treatment has been planned according to current medical practices, but problems sometimes occur. Call your health care provider if you have any problems or questions after your procedure. What can I expect after the procedure? After the procedure, it is common to have:  Pain.  Soreness and numbness in your incision areas.  Vaginal bleeding and discharge.  Constipation.  Temporary problems emptying the bladder.  Feelings of sadness or other emotions. Follow these instructions at home: Medicines  Take over-the-counter and prescription medicines only as told by your health care provider.  If you were prescribed an antibiotic medicine, take it as told by your health care provider. Do not stop taking the antibiotic even if you start to feel better.  Do not drive or operate heavy machinery while taking prescription pain medicine. Activity  Return to your normal activities as told by your health care provider. Ask your health care provider what activities are safe for you.  Get regular exercise as told by your health care provider. You may be told to take short walks every day and go farther each time.  Do not lift anything that is heavier than 10 lb (4.5 kg). General instructions   Do not put anything in your vagina for 6 weeks after your surgery or as told by your health care provider. This includes tampons and douches.  Do not have sex until your health care provider says you can.  Do not take baths, swim, or use a hot tub until your health care provider approves.  Drink enough fluid to keep your urine clear or pale yellow.  Do not drive for 24 hours if you were given a sedative.  Keep all follow-up visits as told by your health  care provider. This is important. Contact a health care provider if:  Your pain medicine is not helping.  You have a fever.  You have redness, swelling, or pain at your incision site.  You have blood, pus, or a bad-smelling discharge from your vagina.  You continue to have difficulty urinating. Get help right away if:  You have severe abdominal or back pain.  You have heavy bleeding from your vagina.  You have chest pain or shortness of breath. This information is not intended to replace advice given to you by your health care provider. Make sure you discuss any questions you have with your health care provider. Document Revised: 04/01/2016 Document Reviewed: 08/24/2015 Elsevier Patient Education  2020 [place]. Vaginal Hysterectomy  A vaginal hysterectomy is a procedure to remove all or part of the uterus through a small incision in the vagina. In this procedure, your health care provider may remove your entire uterus, including the lower end (cervix). You may need a vaginal hysterectomy to treat:  Uterine fibroids.  A condition that causes the lining of the uterus to grow in other areas (endometriosis).  Problems with pelvic support.  Cancer of the cervix, ovaries, uterus, or tissue that lines the uterus (endometrium).  Excessive (dysfunctional) uterine bleeding. When removing your uterus, your health care provider may also remove the organs that produce eggs (ovaries) and the tubes that carry eggs to your uterus (fallopian tubes). After a vaginal hysterectomy, you will no longer be able to have a baby. You will also no longer get your  menstrual period. Tell a health care provider about:  Any allergies you have.  All medicines you are taking, including vitamins, herbs, eye drops, creams, and over-the-counter medicines.  Any problems you or family members have had with anesthetic medicines.  Any blood disorders you have.  Any surgeries you have had.  Any medical  conditions you have.  Whether you are pregnant or may be pregnant. What are the risks? Generally, this is a safe procedure. However, problems may occur, including:  Bleeding.  Infection.  A blood clot that forms in your leg and travels to your lungs (pulmonary embolism).  Damage to surrounding organs.  Pain during sex. What happens before the procedure?  Ask your health care provider what organs will be removed during surgery.  Ask your health care provider about: ? Changing or stopping your regular medicines. This is especially important if you are taking diabetes medicines or blood thinners. ? Taking medicines such as aspirin and ibuprofen. These medicines can thin your blood. Do not take these medicines before your procedure if your health care provider instructs you not to.  Follow instructions from your health care provider about eating or drinking restrictions.  Do not use any tobacco products, such as cigarettes, chewing tobacco, and e-cigarettes. If you need help quitting, ask your health care provider.  Plan to have someone take you home after discharge from the hospital. What happens during the procedure?  To reduce your risk of infection: ? Your health care team will wash or sanitize their hands. ? Your skin will be washed with soap.  An IV tube will be inserted into one of your veins.  You may be given antibiotic medicine to help prevent infection.  You will be given one or more of the following: ? A medicine to help you relax (sedative). ? A medicine to numb the area (local anesthetic). ? A medicine to make you fall asleep (general anesthetic). ? A medicine that is injected into an area of your body to numb everything beyond the injection site (regional anesthetic).  Your surgeon will make an incision in your vagina.  Your surgeon will locate and remove all or part of your uterus.  Your ovaries and fallopian tubes may be removed at the same time.  The  incision will be closed with stitches (sutures) that dissolve over time. The procedure may vary among health care providers and hospitals. What happens after the procedure?  Your blood pressure, heart rate, breathing rate, and blood oxygen level will be monitored often until the medicines you were given have worn off.  You will be encouraged to get up and walk around after a few hours to help prevent complications.  You may have IV tubes in place for a few days.  You will be given pain medicine as needed.  Do not drive for 24 hours if you were given a sedative. This information is not intended to replace advice given to you by your health care provider. Make sure you discuss any questions you have with your health care provider. Document Revised: 04/01/2016 Document Reviewed: 08/24/2015 Elsevier Patient Education  2020 [employer].

## 2020-07-14 DIAGNOSIS — Z79899 Other long term (current) drug therapy: Secondary | ICD-10-CM | POA: Diagnosis not present

## 2020-07-15 NOTE — Pre-Procedure Instructions (Signed)
Your procedure is scheduled on Tuesday, November 30, from 3 PM- 4:30 PM.  Report to [facility] "A" at 1:00 P.M., and check in at the Admitting office.  Call this number if you have problems the morning of surgery:  [phone].  Call [phone] if you have any questions prior to your surgery date Monday-Friday 8am-4pm.    Remember:  Do not eat after midnight the night before your surgery.  You may drink clear liquids until 12:00 Noon the morning of your surgery.   Clear liquids allowed are: Water, Non-Citrus Juices (without pulp), Carbonated Beverages, Clear Tea, Black Coffee Only, and Gatorade.    Take these medicines the morning of surgery with A SIP OF WATER:  amLODipine (NORVASC) esomeprazole ([place]) oxyCODONE-acetaminophen (PERCOCET)- if needed   As of today, STOP taking any Aspirin (unless otherwise instructed by your surgeon), diclofenac (VOLTAREN), Aleve, Naproxen, Ibuprofen, Motrin, Advil, Goody's, BC's, all herbal medications, fish oil, and all vitamins.          The Morning of Surgery:            Do not wear jewelry, make up, or nail polish.            Do not wear lotions, powders, perfumes, or deodorant.            Do not shave 48 hours prior to surgery.              Do not bring valuables to the hospital.            [facility] is not responsible for any belongings or valuables.  Do NOT Smoke (Tobacco/Vaping) or drink Alcohol 24 hours prior to your procedure.  If you use a CPAP at night, you may bring all equipment for your overnight stay.   Contacts, glasses, dentures or bridgework may not be worn into surgery.      For patients admitted to the hospital, discharge time will be determined by your treatment team.   Patients discharged the day of surgery will not be allowed to drive home, and someone needs to stay with them for 24 hours.    Special instructions:   [place]- Preparing For Surgery  Before surgery, you can play an important  role. Because skin is not sterile, your skin needs to be as free of germs as possible. You can reduce the number of germs on your skin by washing with CHG (chlorahexidine gluconate) Soap before surgery.  CHG is an antiseptic cleaner which kills germs and bonds with the skin to continue killing germs even after washing.    Oral Hygiene is also important to reduce your risk of infection.  Remember - BRUSH YOUR TEETH THE MORNING OF SURGERY WITH YOUR REGULAR TOOTHPASTE  Please do not use if you have an allergy to CHG or antibacterial soaps. If your skin becomes reddened/irritated stop using the CHG.  Do not shave (including legs and underarms) for at least 48 hours prior to first CHG shower. It is OK to shave your face.  Please follow these instructions carefully.   1. Shower the NIGHT BEFORE SURGERY and the MORNING OF SURGERY with CHG Soap.   2. If you chose to wash your hair, wash your hair first as usual with your normal shampoo.  3. After you shampoo, rinse your hair and body thoroughly to remove the shampoo.  4. Use CHG as you would any other liquid soap. You can apply CHG directly to the skin and wash  gently with a scrungie or a clean washcloth.   5. Apply the CHG Soap to your body ONLY FROM THE NECK DOWN.  Do not use on open wounds or open sores. Avoid contact with your eyes, ears, mouth and genitals (private parts). Wash Face and genitals (private parts)  with your normal soap.   6. Wash thoroughly, paying special attention to the area where your surgery will be performed.  7. Thoroughly rinse your body with warm water from the neck down.  8. DO NOT shower/wash with your normal soap after using and rinsing off the CHG Soap.  9. Pat yourself dry with a CLEAN TOWEL.  10. Wear CLEAN PAJAMAS to bed the night before surgery  11. Place CLEAN SHEETS on your bed the night of your first shower and DO NOT SLEEP WITH PETS.   Day of Surgery: SHOWER Wear Clean/Comfortable clothing the  morning of surgery Do not apply any deodorants/lotions.   Remember to brush your teeth WITH YOUR REGULAR TOOTHPASTE.   Please read over the following fact sheets that you were given.

## 2020-07-16 ENCOUNTER — Encounter (HOSPITAL_COMMUNITY)
Admission: RE | Admit: 2020-07-16 | Discharge: 2020-07-16 | Disposition: A | Discharge status: Home / Self Care | Payer: Medicaid Other | Source: Ambulatory Visit | Attending: Obstetrics and Gynecology | Admitting: Obstetrics and Gynecology

## 2020-07-16 ENCOUNTER — Encounter (HOSPITAL_COMMUNITY): Payer: Self-pay

## 2020-07-16 ENCOUNTER — Other Ambulatory Visit: Payer: Self-pay

## 2020-07-16 DIAGNOSIS — Z01818 Encounter for other preprocedural examination: Secondary | ICD-10-CM | POA: Diagnosis not present

## 2020-07-16 DIAGNOSIS — I10 Essential (primary) hypertension: Secondary | ICD-10-CM | POA: Insufficient documentation

## 2020-07-16 HISTORY — DX: Anemia, unspecified: D64.9

## 2020-07-16 HISTORY — DX: Gastro-esophageal reflux disease without esophagitis: K21.9

## 2020-07-16 HISTORY — DX: Headache, unspecified: R51.9

## 2020-07-16 LAB — TYPE AND SCREEN
ABO/RH(D): O POS
Antibody Screen: NEGATIVE

## 2020-07-16 LAB — BASIC METABOLIC PANEL
Anion gap: 7 (ref 5–15)
BUN: 12 mg/dL (ref 6–20)
CO2: 27 mmol/L (ref 22–32)
Calcium: 8.6 mg/dL — ABNORMAL LOW (ref 8.9–10.3)
Chloride: 100 mmol/L (ref 98–111)
Creatinine, Ser: 0.77 mg/dL (ref 0.44–1.00)
GFR, Estimated: >60 mL/min (ref >60)
Glucose, Bld: 85 mg/dL (ref 70–99)
Potassium: 3.6 mmol/L (ref 3.5–5.1)
Sodium: 134 mmol/L — ABNORMAL LOW (ref 135–145)

## 2020-07-16 LAB — CBC
HCT: 34.1 % — ABNORMAL LOW (ref 36.0–46.0)
Hemoglobin: 10.4 g/dL — ABNORMAL LOW (ref 12.0–15.0)
MCH: 26.1 pg (ref 26.0–34.0)
MCHC: 30.5 g/dL (ref 30.0–36.0)
MCV: 85.5 fL (ref 80.0–100.0)
Platelets: 338 10*3/uL (ref 150–400)
RBC: 3.99 MIL/uL (ref 3.87–5.11)
RDW: 17.4 % — ABNORMAL HIGH (ref 11.5–15.5)
WBC: 6.3 10*3/uL (ref 4.0–10.5)
nRBC: 0 % (ref 0.0–0.2)

## 2020-07-16 NOTE — Pre-Procedure Instructions (Signed)
Your procedure is scheduled on Tuesday, November 30, from 3 PM- 4:30 PM.  Report to [facility] "[facility]" at 1:00 P.M., and check in at the Admitting office.  Call this number if you have problems the morning of surgery:  [phone].  Call [phone] if you have any questions prior to your surgery date Monday-Friday 8am-4pm.    Remember:  Do not eat after midnight the night before your surgery.  You may drink clear liquids until 12:00 Noon the morning of your surgery.   Clear liquids allowed are: Water, Non-Citrus Juices (without pulp), Carbonated Beverages, Clear Tea, Black Coffee Only, and Gatorade.    Take these medicines the morning of surgery with A SIP OF WATER:  amLODipine (NORVASC) esomeprazole (NEXIUM) atenolol (TENORMIN)   oxyCODONE-acetaminophen (PERCOCET)- if needed   As of today, STOP taking any Aspirin (unless otherwise instructed by your surgeon), diclofenac (VOLTAREN), Aleve, Naproxen, Ibuprofen, Motrin, Advil, Goody's, BC's, all herbal medications, fish oil, and all vitamins.          The Morning of Surgery:            Do not wear jewelry, make up, or nail polish.            Do not wear lotions, powders, perfumes, or deodorant.            Do not shave 48 hours prior to surgery.              Do not bring valuables to the hospital.            [facility] is not responsible for any belongings or valuables.  Do NOT Smoke (Tobacco/Vaping) or drink Alcohol 24 hours prior to your procedure.  If you use a CPAP at night, you may bring all equipment for your overnight stay.   Contacts, glasses, dentures or bridgework may not be worn into surgery.      For patients admitted to the hospital, discharge time will be determined by your treatment team.   Patients discharged the day of surgery will not be allowed to drive home, and someone needs to stay with them for 24 hours.    Special instructions:   [place]- Preparing For Surgery  Before surgery,  you can play an important role. Because skin is not sterile, your skin needs to be as free of germs as possible. You can reduce the number of germs on your skin by washing with CHG (chlorahexidine gluconate) Soap before surgery.  CHG is an antiseptic cleaner which kills germs and bonds with the skin to continue killing germs even after washing.    Oral Hygiene is also important to reduce your risk of infection.  Remember - BRUSH YOUR TEETH THE MORNING OF SURGERY WITH YOUR REGULAR TOOTHPASTE  Please do not use if you have an allergy to CHG or antibacterial soaps. If your skin becomes reddened/irritated stop using the CHG.  Do not shave (including legs and underarms) for at least 48 hours prior to first CHG shower. It is OK to shave your face.  Please follow these instructions carefully.   1. Shower the NIGHT BEFORE SURGERY and the MORNING OF SURGERY with CHG Soap.   2. If you chose to wash your hair, wash your hair first as usual with your normal shampoo.  3. After you shampoo, rinse your hair and body thoroughly to remove the shampoo.  4. Use CHG as you would any other liquid soap. You can apply CHG directly to  the skin and wash gently with a scrungie or a clean washcloth.   5. Apply the CHG Soap to your body ONLY FROM THE NECK DOWN.  Do not use on open wounds or open sores. Avoid contact with your eyes, ears, mouth and genitals (private parts). Wash Face and genitals (private parts)  with your normal soap.   6. Wash thoroughly, paying special attention to the area where your surgery will be performed.  7. Thoroughly rinse your body with warm water from the neck down.  8. DO NOT shower/wash with your normal soap after using and rinsing off the CHG Soap.  9. Pat yourself dry with a CLEAN TOWEL.  10. Wear CLEAN PAJAMAS to bed the night before surgery  11. Place CLEAN SHEETS on your bed the night of your first shower and DO NOT SLEEP WITH PETS.   Day of Surgery: SHOWER Wear  Clean/Comfortable clothing the morning of surgery Do not apply any deodorants/lotions.   Remember to brush your teeth WITH YOUR REGULAR TOOTHPASTE.   Please read over the following fact sheets that you were given.

## 2020-07-16 NOTE — Progress Notes (Addendum)
PCP - Tera Partridge, PA-C at [facility] Cardiologist - denies  PPM/ICD - denies   Chest x-ray - n/a EKG - 07/16/2020 Stress Test - denies ECHO - denies Cardiac Cath - denies  Sleep Study - denies  Patient instructed to hold all Aspirin, NSAID's, herbal medications, fish oil and vitamins 7 days prior to surgery. Also instructed to hold voltaren as of today.    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 No cardiac symptoms during PAT appt.   Patient denies shortness of breath, fever, cough and chest pain at PAT appointment   All instructions explained to the patient, with a verbal understanding of the material. Patient agrees to go over the instructions while at home for a better understanding. Patient also instructed to self quarantine after being tested for COVID-19. The opportunity to ask questions was provided.

## 2020-07-18 ENCOUNTER — Other Ambulatory Visit (HOSPITAL_COMMUNITY)
Admission: RE | Admit: 2020-07-18 | Discharge: 2020-07-18 | Disposition: A | Discharge status: Home / Self Care | Payer: Medicaid Other | Source: Ambulatory Visit | Attending: Obstetrics and Gynecology | Admitting: Obstetrics and Gynecology

## 2020-07-18 DIAGNOSIS — Z01812 Encounter for preprocedural laboratory examination: Secondary | ICD-10-CM | POA: Diagnosis not present

## 2020-07-18 DIAGNOSIS — Z20822 Contact with and (suspected) exposure to covid-19: Secondary | ICD-10-CM | POA: Diagnosis not present

## 2020-07-18 LAB — SARS CORONAVIRUS 2 (TAT 6-24 HRS): SARS Coronavirus 2: NEGATIVE

## 2020-07-21 MED ORDER — DEXTROSE 5 % IV SOLN
3.0000 g | INTRAVENOUS | Status: DC
  Filled 2020-07-21: qty 3000

## 2020-07-22 ENCOUNTER — Other Ambulatory Visit: Payer: Self-pay

## 2020-07-22 ENCOUNTER — Observation Stay (HOSPITAL_COMMUNITY)
Admission: RE | Admit: 2020-07-22 | Discharge: 2020-07-23 | Disposition: A | Discharge status: Home / Self Care | Payer: Medicaid Other | Attending: Obstetrics and Gynecology | Admitting: Obstetrics and Gynecology

## 2020-07-22 ENCOUNTER — Observation Stay (HOSPITAL_COMMUNITY): Payer: Medicaid Other | Admitting: Emergency Medicine

## 2020-07-22 ENCOUNTER — Observation Stay (HOSPITAL_COMMUNITY): Payer: Medicaid Other

## 2020-07-22 ENCOUNTER — Encounter (HOSPITAL_COMMUNITY)
Admission: RE | Disposition: A | Discharge status: Home / Self Care | Payer: Self-pay | Source: Home / Self Care | Attending: Obstetrics and Gynecology

## 2020-07-22 ENCOUNTER — Encounter (HOSPITAL_COMMUNITY): Payer: Self-pay | Admitting: Obstetrics and Gynecology

## 2020-07-22 DIAGNOSIS — Z9889 Other specified postprocedural states: Secondary | ICD-10-CM

## 2020-07-22 DIAGNOSIS — I10 Essential (primary) hypertension: Secondary | ICD-10-CM | POA: Diagnosis not present

## 2020-07-22 DIAGNOSIS — N6310 Unspecified lump in the right breast, unspecified quadrant: Secondary | ICD-10-CM

## 2020-07-22 DIAGNOSIS — F17200 Nicotine dependence, unspecified, uncomplicated: Secondary | ICD-10-CM | POA: Insufficient documentation

## 2020-07-22 DIAGNOSIS — D259 Leiomyoma of uterus, unspecified: Secondary | ICD-10-CM | POA: Diagnosis not present

## 2020-07-22 DIAGNOSIS — N838 Other noninflammatory disorders of ovary, fallopian tube and broad ligament: Secondary | ICD-10-CM | POA: Diagnosis not present

## 2020-07-22 HISTORY — PX: VAGINAL HYSTERECTOMY: SHX2639

## 2020-07-22 LAB — COMPREHENSIVE METABOLIC PANEL
ALT: 29 U/L (ref 0–44)
AST: 30 U/L (ref 15–41)
Albumin: 3.4 g/dL — ABNORMAL LOW (ref 3.5–5.0)
Alkaline Phosphatase: 76 U/L (ref 38–126)
Anion gap: 9 (ref 5–15)
BUN: 8 mg/dL (ref 6–20)
CO2: 28 mmol/L (ref 22–32)
Calcium: 8.6 mg/dL — ABNORMAL LOW (ref 8.9–10.3)
Chloride: 100 mmol/L (ref 98–111)
Creatinine, Ser: 0.85 mg/dL (ref 0.44–1.00)
GFR, Estimated: >60 mL/min (ref >60)
Glucose, Bld: 135 mg/dL — ABNORMAL HIGH (ref 70–99)
Potassium: 3.8 mmol/L (ref 3.5–5.1)
Sodium: 137 mmol/L (ref 135–145)
Total Bilirubin: 0.4 mg/dL (ref 0.3–1.2)
Total Protein: 6.8 g/dL (ref 6.5–8.1)

## 2020-07-22 LAB — CBC WITH DIFFERENTIAL/PLATELET
Abs Immature Granulocytes: 0.06 10*3/uL (ref 0.00–0.07)
Basophils Absolute: 0 10*3/uL (ref 0.0–0.1)
Basophils Relative: 0 %
Eosinophils Absolute: 0 10*3/uL (ref 0.0–0.5)
Eosinophils Relative: 0 %
HCT: 31.4 % — ABNORMAL LOW (ref 36.0–46.0)
Hemoglobin: 9.9 g/dL — ABNORMAL LOW (ref 12.0–15.0)
Immature Granulocytes: 1 %
Lymphocytes Relative: 10 %
Lymphs Abs: 1.3 10*3/uL (ref 0.7–4.0)
MCH: 26.2 pg (ref 26.0–34.0)
MCHC: 31.5 g/dL (ref 30.0–36.0)
MCV: 83.1 fL (ref 80.0–100.0)
Monocytes Absolute: 0.2 10*3/uL (ref 0.1–1.0)
Monocytes Relative: 2 %
Neutro Abs: 11.6 10*3/uL — ABNORMAL HIGH (ref 1.7–7.7)
Neutrophils Relative %: 87 %
Platelets: 366 10*3/uL (ref 150–400)
RBC: 3.78 MIL/uL — ABNORMAL LOW (ref 3.87–5.11)
RDW: 17.5 % — ABNORMAL HIGH (ref 11.5–15.5)
WBC: 13.2 10*3/uL — ABNORMAL HIGH (ref 4.0–10.5)
nRBC: 0 % (ref 0.0–0.2)

## 2020-07-22 LAB — POCT PREGNANCY, URINE: Preg Test, Ur: NEGATIVE

## 2020-07-22 SURGERY — HYSTERECTOMY, VAGINAL
Anesthesia: General | Site: Vagina | Laterality: Bilateral

## 2020-07-22 MED ORDER — FENTANYL CITRATE (PF) 250 MCG/5ML IJ SOLN
INTRAMUSCULAR | Status: AC
  Filled 2020-07-22: qty 5

## 2020-07-22 MED ORDER — PANTOPRAZOLE SODIUM 40 MG PO TBEC
40.0000 mg | DELAYED_RELEASE_TABLET | Freq: Every day | ORAL | Status: DC
  Administered 2020-07-23 (×3): 40 mg via ORAL
  Filled 2020-07-22 (×2): qty 1

## 2020-07-22 MED ORDER — LABETALOL HCL 5 MG/ML IV SOLN
20.0000 mg | Freq: Once | INTRAVENOUS | Status: AC
  Administered 2020-07-22: 20 mg via INTRAVENOUS
  Filled 2020-07-22: qty 4

## 2020-07-22 MED ORDER — IBUPROFEN 800 MG PO TABS: 800.0000 mg | ORAL_TABLET | Freq: Three times a day (TID) | ORAL | Status: DC

## 2020-07-22 MED ORDER — HYDROMORPHONE HCL 1 MG/ML IJ SOLN
INTRAMUSCULAR | Status: DC | PRN
Start: 2020-07-22 — End: 2020-07-22
  Administered 2020-07-22: .5 mg via INTRAVENOUS

## 2020-07-22 MED ORDER — HYDROMORPHONE HCL 1 MG/ML IJ SOLN
INTRAMUSCULAR | Status: AC
  Filled 2020-07-22: qty 0.5

## 2020-07-22 MED ORDER — ACETAMINOPHEN 500 MG PO TABS
1000.0000 mg | ORAL_TABLET | ORAL | Status: AC
  Administered 2020-07-22: 1000 mg via ORAL
  Filled 2020-07-22: qty 2

## 2020-07-22 MED ORDER — OXYCODONE HCL 5 MG/5ML PO SOLN: 5.0000 mg | Freq: Once | ORAL | Status: DC | PRN

## 2020-07-22 MED ORDER — ATENOLOL 50 MG PO TABS
50.0000 mg | ORAL_TABLET | Freq: Every day | ORAL | Status: DC
  Administered 2020-07-23: 50 mg via ORAL
  Filled 2020-07-22: qty 1

## 2020-07-22 MED ORDER — OXYCODONE HCL 5 MG PO TABS: 5.0000 mg | ORAL_TABLET | Freq: Once | ORAL | Status: DC | PRN

## 2020-07-22 MED ORDER — LACTATED RINGERS IV SOLN: INTRAVENOUS | Status: DC

## 2020-07-22 MED ORDER — CELECOXIB 200 MG PO CAPS: 200.0000 mg | ORAL_CAPSULE | ORAL | Status: DC

## 2020-07-22 MED ORDER — CHLORHEXIDINE GLUCONATE CLOTH 2 % EX PADS: 6.0000 | MEDICATED_PAD | Freq: Once | CUTANEOUS | Status: DC

## 2020-07-22 MED ORDER — PROMETHAZINE HCL 25 MG/ML IJ SOLN: 6.2500 mg | INTRAMUSCULAR | Status: DC | PRN

## 2020-07-22 MED ORDER — SOD CITRATE-CITRIC ACID 500-334 MG/5ML PO SOLN
30.0000 mL | ORAL | Status: AC
  Administered 2020-07-22: 30 mL via ORAL
  Filled 2020-07-22: qty 15

## 2020-07-22 MED ORDER — LIDOCAINE HCL (PF) 2 % IJ SOLN
INTRAMUSCULAR | Status: AC
  Filled 2020-07-22: qty 5

## 2020-07-22 MED ORDER — ONDANSETRON HCL 4 MG/2ML IJ SOLN
INTRAMUSCULAR | Status: DC | PRN
  Administered 2020-07-22: 4 mg via INTRAVENOUS

## 2020-07-22 MED ORDER — ACETAMINOPHEN 500 MG PO TABS: 1000.0000 mg | ORAL_TABLET | ORAL | Status: DC

## 2020-07-22 MED ORDER — KETOROLAC TROMETHAMINE 30 MG/ML IJ SOLN
INTRAMUSCULAR | Status: AC
  Filled 2020-07-22: qty 1

## 2020-07-22 MED ORDER — CHLORHEXIDINE GLUCONATE 0.12 % MT SOLN
15.0000 mL | Freq: Once | OROMUCOSAL | Status: AC
  Administered 2020-07-22: 15 mL via OROMUCOSAL
  Filled 2020-07-22: qty 15

## 2020-07-22 MED ORDER — PROPOFOL 10 MG/ML IV BOLUS
INTRAVENOUS | Status: AC
  Filled 2020-07-22: qty 20

## 2020-07-22 MED ORDER — MIDAZOLAM HCL 5 MG/5ML IJ SOLN
INTRAMUSCULAR | Status: DC | PRN
  Administered 2020-07-22: 2 mg via INTRAVENOUS

## 2020-07-22 MED ORDER — FENTANYL CITRATE (PF) 100 MCG/2ML IJ SOLN
INTRAMUSCULAR | Status: AC
  Filled 2020-07-22: qty 2

## 2020-07-22 MED ORDER — MIDAZOLAM HCL 2 MG/2ML IJ SOLN
INTRAMUSCULAR | Status: AC
  Filled 2020-07-22: qty 2

## 2020-07-22 MED ORDER — ROCURONIUM BROMIDE 10 MG/ML (PF) SYRINGE
PREFILLED_SYRINGE | INTRAVENOUS | Status: DC | PRN
  Administered 2020-07-22: 60 mg via INTRAVENOUS
  Administered 2020-07-22: 40 mg via INTRAVENOUS
  Administered 2020-07-22: 20 mg via INTRAVENOUS

## 2020-07-22 MED ORDER — KETOROLAC TROMETHAMINE 15 MG/ML IJ SOLN
15.0000 mg | INTRAMUSCULAR | Status: DC
  Filled 2020-07-22: qty 1

## 2020-07-22 MED ORDER — OXYCODONE-ACETAMINOPHEN 5-325 MG PO TABS: 1.0000 | ORAL_TABLET | ORAL | Status: DC | PRN

## 2020-07-22 MED ORDER — ONDANSETRON HCL 4 MG PO TABS: 4.0000 mg | ORAL_TABLET | Freq: Four times a day (QID) | ORAL | Status: DC | PRN

## 2020-07-22 MED ORDER — SENNA 8.6 MG PO TABS
1.0000 | ORAL_TABLET | Freq: Two times a day (BID) | ORAL | Status: DC
  Administered 2020-07-22 – 2020-07-23 (×2): 8.6 mg via ORAL
  Filled 2020-07-22 (×2): qty 1

## 2020-07-22 MED ORDER — ORAL CARE MOUTH RINSE: 15.0000 mL | Freq: Once | OROMUCOSAL | Status: AC

## 2020-07-22 MED ORDER — PROPOFOL 10 MG/ML IV BOLUS
INTRAVENOUS | Status: DC | PRN
  Administered 2020-07-22: 200 mg via INTRAVENOUS

## 2020-07-22 MED ORDER — SUGAMMADEX SODIUM 200 MG/2ML IV SOLN
INTRAVENOUS | Status: DC | PRN
  Administered 2020-07-22: 280 mg via INTRAVENOUS

## 2020-07-22 MED ORDER — GABAPENTIN 300 MG PO CAPS: 300.0000 mg | ORAL_CAPSULE | ORAL | Status: DC

## 2020-07-22 MED ORDER — DEXTROSE 5 % IV SOLN
INTRAVENOUS | Status: DC | PRN
  Administered 2020-07-22: 3 g via INTRAVENOUS

## 2020-07-22 MED ORDER — ZOLPIDEM TARTRATE 5 MG PO TABS
5.0000 mg | ORAL_TABLET | Freq: Every evening | ORAL | Status: DC | PRN
  Administered 2020-07-23: 5 mg via ORAL
  Filled 2020-07-22: qty 1

## 2020-07-22 MED ORDER — SIMETHICONE 80 MG PO CHEW
80.0000 mg | CHEWABLE_TABLET | Freq: Four times a day (QID) | ORAL | Status: DC | PRN
  Administered 2020-07-22: 80 mg via ORAL
  Filled 2020-07-22: qty 1

## 2020-07-22 MED ORDER — ONDANSETRON HCL 4 MG/2ML IJ SOLN
4.0000 mg | Freq: Four times a day (QID) | INTRAMUSCULAR | Status: DC | PRN
  Administered 2020-07-22 (×2): 4 mg via INTRAVENOUS
  Filled 2020-07-22 (×2): qty 2

## 2020-07-22 MED ORDER — DEXAMETHASONE SODIUM PHOSPHATE 10 MG/ML IJ SOLN
INTRAMUSCULAR | Status: AC
  Filled 2020-07-22: qty 1

## 2020-07-22 MED ORDER — KETOROLAC TROMETHAMINE 30 MG/ML IJ SOLN
INTRAMUSCULAR | Status: DC | PRN
  Administered 2020-07-22: 30 mg via INTRAVENOUS

## 2020-07-22 MED ORDER — TRAZODONE HCL 150 MG PO TABS
150.0000 mg | ORAL_TABLET | Freq: Every day | ORAL | Status: DC
  Administered 2020-07-22: 150 mg via ORAL
  Filled 2020-07-22: qty 1

## 2020-07-22 MED ORDER — BUMETANIDE 0.5 MG PO TABS
0.5000 mg | ORAL_TABLET | Freq: Every day | ORAL | Status: DC
  Administered 2020-07-22 – 2020-07-23 (×2): 0.5 mg via ORAL
  Filled 2020-07-22 (×2): qty 1

## 2020-07-22 MED ORDER — LABETALOL HCL 5 MG/ML IV SOLN: 20.0000 mg | Freq: Once | INTRAVENOUS | Status: DC

## 2020-07-22 MED ORDER — LIDOCAINE 2% (20 MG/ML) 5 ML SYRINGE
INTRAMUSCULAR | Status: DC | PRN
  Administered 2020-07-22: 60 mg via INTRAVENOUS

## 2020-07-22 MED ORDER — FENTANYL CITRATE (PF) 250 MCG/5ML IJ SOLN
INTRAMUSCULAR | Status: DC | PRN
  Administered 2020-07-22 (×3): 50 ug via INTRAVENOUS
  Administered 2020-07-22: 100 ug via INTRAVENOUS

## 2020-07-22 MED ORDER — HYDROMORPHONE HCL 1 MG/ML IJ SOLN
1.0000 mg | INTRAMUSCULAR | Status: DC | PRN
  Administered 2020-07-22 (×4): 1 mg via INTRAVENOUS
  Filled 2020-07-22 (×5): qty 1

## 2020-07-22 MED ORDER — OXYCODONE-ACETAMINOPHEN 5-325 MG PO TABS
2.0000 | ORAL_TABLET | ORAL | Status: DC | PRN
  Administered 2020-07-22 – 2020-07-23 (×3): 2 via ORAL
  Filled 2020-07-22 (×4): qty 2

## 2020-07-22 MED ORDER — KETOROLAC TROMETHAMINE 30 MG/ML IJ SOLN
30.0000 mg | Freq: Four times a day (QID) | INTRAMUSCULAR | Status: AC
  Administered 2020-07-22 – 2020-07-23 (×3): 30 mg via INTRAVENOUS
  Filled 2020-07-22 (×3): qty 1

## 2020-07-22 MED ORDER — DEXTROSE 5 % IV SOLN: 3.0000 g | INTRAVENOUS | Status: DC

## 2020-07-22 MED ORDER — ROCURONIUM BROMIDE 10 MG/ML (PF) SYRINGE
PREFILLED_SYRINGE | INTRAVENOUS | Status: AC
  Filled 2020-07-22: qty 10

## 2020-07-22 MED ORDER — POVIDONE-IODINE 10 % EX SWAB
2.0000 "application " | Freq: Once | CUTANEOUS | Status: AC
  Administered 2020-07-22: 2 via TOPICAL

## 2020-07-22 MED ORDER — FENTANYL CITRATE (PF) 100 MCG/2ML IJ SOLN
25.0000 ug | INTRAMUSCULAR | Status: DC | PRN
  Administered 2020-07-22 (×3): 50 ug via INTRAVENOUS

## 2020-07-22 MED ORDER — DEXAMETHASONE SODIUM PHOSPHATE 10 MG/ML IJ SOLN
INTRAMUSCULAR | Status: DC | PRN
  Administered 2020-07-22: 4 mg via INTRAVENOUS

## 2020-07-22 MED ORDER — ONDANSETRON HCL 4 MG/2ML IJ SOLN
INTRAMUSCULAR | Status: AC
  Filled 2020-07-22: qty 2

## 2020-07-22 MED ORDER — 0.9 % SODIUM CHLORIDE (POUR BTL) OPTIME
TOPICAL | Status: DC | PRN
  Administered 2020-07-22: 1000 mL

## 2020-07-22 SURGICAL SUPPLY — 27 items
BLADE SURG 10 STRL SS (BLADE) ×6 IMPLANT
CANISTER SUCT 3000ML PPV (MISCELLANEOUS) ×3 IMPLANT
COVER WAND RF STERILE (DRAPES) ×3 IMPLANT
GAUZE 4X4 16PLY RFD (DISPOSABLE) ×6 IMPLANT
GLOVE BIO SURGEON STRL SZ7.5 (GLOVE) ×3 IMPLANT
GLOVE BIOGEL PI IND STRL 6.5 (GLOVE) ×1 IMPLANT
GLOVE BIOGEL PI IND STRL 7.0 (GLOVE) ×2 IMPLANT
GLOVE BIOGEL PI IND STRL 8 (GLOVE) ×1 IMPLANT
GLOVE BIOGEL PI INDICATOR 6.5 (GLOVE) ×2
GLOVE BIOGEL PI INDICATOR 7.0 (GLOVE) ×4
GLOVE BIOGEL PI INDICATOR 8 (GLOVE) ×2
GOWN STRL REUS W/ TWL LRG LVL3 (GOWN DISPOSABLE) ×2 IMPLANT
GOWN STRL REUS W/ TWL XL LVL3 (GOWN DISPOSABLE) ×2 IMPLANT
GOWN STRL REUS W/TWL LRG LVL3 (GOWN DISPOSABLE) ×6
GOWN STRL REUS W/TWL XL LVL3 (GOWN DISPOSABLE) ×6
KIT TURNOVER KIT B (KITS) ×3 IMPLANT
NS IRRIG 1000ML POUR BTL (IV SOLUTION) ×3 IMPLANT
PACK VAGINAL WOMENS (CUSTOM PROCEDURE TRAY) ×3 IMPLANT
PAD OB MATERNITY 4.3X12.25 (PERSONAL CARE ITEMS) ×3 IMPLANT
SUT VIC AB 2-0 CT1 18 (SUTURE) ×3 IMPLANT
SUT VIC AB 2-0 CT1 27 (SUTURE) ×3
SUT VIC AB 2-0 CT1 TAPERPNT 27 (SUTURE) ×1 IMPLANT
SUT VIC AB PLUS 45CM 1-MO-4 (SUTURE) ×6 IMPLANT
SUT VICRYL 1 TIES 12X18 (SUTURE) IMPLANT
TOWEL GREEN STERILE FF (TOWEL DISPOSABLE) ×3 IMPLANT
TRAY FOLEY W/BAG SLVR 14FR (SET/KITS/TRAYS/PACK) ×3 IMPLANT
UNDERPAD 30X36 HEAVY ABSORB (UNDERPADS AND DIAPERS) ×3 IMPLANT

## 2020-07-22 NOTE — Discharge Instructions (Signed)
Vaginal Hysterectomy, Care After Refer to this sheet in the next few weeks. These instructions provide you with information about caring for yourself after your procedure. Your health care provider may also give you more specific instructions. Your treatment has been planned according to current medical practices, but problems sometimes occur. Call your health care provider if you have any problems or questions after your procedure. What can I expect after the procedure? After the procedure, it is common to have:  Pain.  Soreness and numbness in your incision areas.  Vaginal bleeding and discharge.  Constipation.  Temporary problems emptying the bladder.  Feelings of sadness or other emotions. Follow these instructions at home: Medicines  Take over-the-counter and prescription medicines only as told by your health care provider.  If you were prescribed an antibiotic medicine, take it as told by your health care provider. Do not stop taking the antibiotic even if you start to feel better.  Do not drive or operate heavy machinery while taking prescription pain medicine. Activity  Return to your normal activities as told by your health care provider. Ask your health care provider what activities are safe for you.  Get regular exercise as told by your health care provider. You may be told to take short walks every day and go farther each time.  Do not lift anything that is heavier than 10 lb (4.5 kg). General instructions   Do not put anything in your vagina for 6 weeks after your surgery or as told by your health care provider. This includes tampons and douches.  Do not have sex until your health care provider says you can.  Do not take baths, swim, or use a hot tub until your health care provider approves.  Drink enough fluid to keep your urine clear or pale yellow.  Do not drive for 24 hours if you were given a sedative.  Keep all follow-up visits as told by your health  care provider. This is important. Contact a health care provider if:  Your pain medicine is not helping.  You have a fever.  You have redness, swelling, or pain at your incision site.  You have blood, pus, or a bad-smelling discharge from your vagina.  You continue to have difficulty urinating. Get help right away if:  You have severe abdominal or back pain.  You have heavy bleeding from your vagina.  You have chest pain or shortness of breath. This information is not intended to replace advice given to you by your health care provider. Make sure you discuss any questions you have with your health care provider. Document Revised: 04/01/2016 Document Reviewed: 08/24/2015 Elsevier Patient Education  2020 [employer]  

## 2020-07-22 NOTE — Interval H&P Note (Signed)
History and Physical Interval Note:  07/22/2020 12:35 PM  Shari Smith  has presented today for surgery, with the diagnosis of UTERINE FIBROIDS.  The various methods of treatment have been discussed with the patient and family. After consideration of risks, benefits and other options for treatment, the patient has consented to  Procedure(s): VAGINAL HYSTERECTOMY (N/A) as a surgical intervention.  The patient's history has been reviewed, patient examined, no change in status, stable for surgery.  I have reviewed the patient's chart and labs.  Questions were answered to the patient's satisfaction.     Chancy Milroy

## 2020-07-22 NOTE — Anesthesia Preprocedure Evaluation (Addendum)
Anesthesia Evaluation  Patient identified by MRN, date of birth, ID band Patient awake    Reviewed: Allergy & Precautions, NPO status , Patient's Chart, lab work & pertinent test results, reviewed documented beta blocker date and time   History of Anesthesia Complications Negative for: history of anesthetic complications  Airway Mallampati: II  TM Distance: >3 FB Neck ROM: Full    Dental  (+) Dental Advisory Given   Pulmonary Current Smoker and Patient abstained from smoking.,    Pulmonary exam normal        Cardiovascular hypertension, Pt. on medications and Pt. on home beta blockers Normal cardiovascular exam     Neuro/Psych  Headaches, PSYCHIATRIC DISORDERS Anxiety    GI/Hepatic Neg liver ROS, GERD  Medicated and Controlled,  Endo/Other  Morbid obesity  Renal/GU negative Renal ROS     Musculoskeletal  (+) Arthritis ,   Abdominal   Peds  Hematology  (+) anemia ,   Anesthesia Other Findings Covid test negative   Reproductive/Obstetrics  s/p tubal ligation                             Anesthesia Physical Anesthesia Plan  ASA: III  Anesthesia Plan: General   Post-op Pain Management:    Induction: Intravenous  PONV Risk Score and Plan: 3 and Treatment may vary due to age or medical condition, Ondansetron, Dexamethasone and Midazolam  Airway Management Planned: Oral ETT  Additional Equipment: None  Intra-op Plan:   Post-operative Plan: Extubation in OR  Informed Consent: I have reviewed the patients History and Physical, chart, labs and discussed the procedure including the risks, benefits and alternatives for the proposed anesthesia with the patient or authorized representative who has indicated his/her understanding and acceptance.     Dental advisory given  Plan Discussed with: CRNA and Anesthesiologist  Anesthesia Plan Comments:        Anesthesia Quick  Evaluation

## 2020-07-22 NOTE — Op Note (Signed)
Ruthel Martine Grassel PROCEDURE DATE: 07/22/2020  PREOPERATIVE DIAGNOSIS:  Symptomatic fibroids, menorrhagia POSTOPERATIVE DIAGNOSIS:  Symptomatic fibroids, menorrhagia SURGEON:   Arlina Robes, M.D. Terrence DupontLeland Johns, M.D.  An experienced assistant was required given the standard of surgical care given the complexity of the case.  This assistant was needed for exposure, dissection, suctioning, retraction, and for overall help during the procedure.  OPERATION:  Total Vaginal hysterectomy with Bilateral salpingectomy ANESTHESIA:  General endotracheal.  INDICATIONS: The patient is a 49 y.o. No obstetric history on file. with history of symptomatic uterine fibroids/menorrhagia. The patient made a decision to undergo definite surgical treatment. On the preoperative visit, the risks, benefits, indications, and alternatives of the procedure were reviewed with the patient.  On the day of surgery, the risks of surgery were again discussed with the patient including but not limited to: bleeding which may require transfusion or reoperation; infection which may require antibiotics; injury to bowel, bladder, ureters or other surrounding organs; need for additional procedures; thromboembolic phenomenon, incisional problems and other postoperative/anesthesia complications. Written informed consent was obtained.    OPERATIVE FINDINGS: A 12 week size uterus with multiple fibroids,   normal tubes and ovaries bilaterally.  ESTIMATED BLOOD LOSS: 300 ml FLUIDS:  As Recorded URINE OUTPUT:  As Recorded SPECIMENS:  Uterus, cervix and portion of tubes sent to pathology COMPLICATIONS:  None immediate.  DESCRIPTION OF PROCEDURE:  The patient received intravenous antibiotics and had sequential compression devices applied to her lower extremities while in the preoperative area.  She was then taken to the operating room where general anesthesia was administered and was found to be adequate.  She was placed in the dorsal  lithotomy position, and was prepped and draped in a sterile manner.  A Foley catheter was inserted into her bladder and attached to constant drainage. After an adequate timeout was performed, attention was turned to her pelvis.  A weighted speculum was then placed in the vagina, and the posterior lip of the cervix were grasped with tenaculum. The posterior cul de sac was entered sharply, long bill speculum was placed. The anterior lip of the cervix was grasped with tenaculum. The anterior cul-de-sac was then entered sharply without difficulty and a retractor was placed.   Zeplin clamps were then used to clamp the uterosacral ligaments on either side.  They were then cut and sutured ligated with 0 Vicryl.  Of note, all sutures used in this case were 0 Vicryl unless otherwise noted.   The cardinal ligaments were then clamped, cut and ligated. The uterine vessels and broad ligaments were then serially clamped with the Heaney clamps, cut, and suture ligated on both sides.  Excellent hemostasis was noted at this point.  Due to the size of the uterus, it was bivalved and morcellated to allow placement of the final clamps. The final pedicle involving the IP was the clamped, cut and tied with a free tie and then in a [place] fashion. The specimen was then passed off for pathology. Each fallopian tube was identified and clamped, cut and ligated with a free tie.   After completion of the hysterectomy and salpingectomy, all pedicles from the uterosacral ligament to the cornua were examined hemostasis was confirmed.The perineum was then closed in a purse string fashion with 2/0 Vicryl.  The vaginal cuff was then closed with 2/0 Vicryl.  All instruments were then removed from the pelvis.  The patient tolerated the procedure well.  All instruments, needles, and sponge counts were correct x 2. The patient  was taken to the recovery room in stable condition.    Arlina Robes, MD, [place] Attending [place], [facility]

## 2020-07-22 NOTE — Transfer of Care (Signed)
Immediate Anesthesia Transfer of Care Note  Patient: Shari Smith  Procedure(s) Performed: TOTAL VAGINAL HYSTERECTOMY WITH BILATERAL SALPINGECTOMIES (Bilateral Vagina )  Patient Location: PACU  Anesthesia Type:General  Level of Consciousness: awake, alert  and oriented  Airway & Oxygen Therapy: Patient Spontanous Breathing and Patient connected to face mask oxygen  Post-op Assessment: Report given to RN, Post -op Vital signs reviewed and stable and Patient moving all extremities  Post vital signs: Reviewed and stable  Last Vitals:  Vitals Value Taken Time  BP    Temp    Pulse    Resp    SpO2      Last Pain:  Vitals:   07/22/20 1114  TempSrc:   PainSc: 8          Complications: No complications documented.

## 2020-07-22 NOTE — Anesthesia Procedure Notes (Signed)
Procedure Name: Intubation Date/Time: 07/22/2020 1:00 PM Performed by: Amadeo Garnet, CRNA Pre-anesthesia Checklist: Patient identified, Emergency Drugs available, Suction available and Patient being monitored Patient Re-evaluated:Patient Re-evaluated prior to induction Oxygen Delivery Method: Circle system utilized Preoxygenation: Pre-oxygenation with 100% oxygen Induction Type: IV induction Ventilation: Mask ventilation without difficulty Laryngoscope Size: Mac and 3 Grade View: Grade I Tube type: Oral Tube size: 7.0 mm Number of attempts: 1 Airway Equipment and Method: Stylet and Oral airway Placement Confirmation: ETT inserted through vocal cords under direct vision,  positive ETCO2 and breath sounds checked- equal and bilateral Secured at: 22 cm Tube secured with: Tape Dental Injury: Teeth and Oropharynx as per pre-operative assessment

## 2020-07-22 NOTE — Progress Notes (Signed)
Dr. Rip Harbour notified of Patient's BPs 176/98 and 184/88. No new orders given. Toya Smothers, RN

## 2020-07-22 NOTE — Anesthesia Postprocedure Evaluation (Signed)
Anesthesia Post Note  Patient: Shari Smith  Procedure(s) Performed: TOTAL VAGINAL HYSTERECTOMY WITH BILATERAL SALPINGECTOMIES (Bilateral Vagina )     Patient location during evaluation: PACU Anesthesia Type: General Level of consciousness: awake and alert Pain management: pain level controlled Vital Signs Assessment: post-procedure vital signs reviewed and stable Respiratory status: spontaneous breathing, nonlabored ventilation and respiratory function stable Cardiovascular status: blood pressure returned to baseline and stable Postop Assessment: no apparent nausea or vomiting Anesthetic complications: no   No complications documented.  Last Vitals:  Vitals:   07/22/20 1515 07/22/20 1530  BP: (!) 162/70 102/82  Pulse: 67 (!) 50  Resp: 12 14  Temp:    SpO2: 98% 99%    Last Pain:  Vitals:   07/22/20 1522  TempSrc:   PainSc: [name]

## 2020-07-23 ENCOUNTER — Encounter (HOSPITAL_COMMUNITY): Payer: Self-pay | Admitting: Obstetrics and Gynecology

## 2020-07-23 DIAGNOSIS — D259 Leiomyoma of uterus, unspecified: Secondary | ICD-10-CM | POA: Diagnosis not present

## 2020-07-23 LAB — CBC
HCT: 31.2 % — ABNORMAL LOW (ref 36.0–46.0)
Hemoglobin: 9.7 g/dL — ABNORMAL LOW (ref 12.0–15.0)
MCH: 25.6 pg — ABNORMAL LOW (ref 26.0–34.0)
MCHC: 31.1 g/dL (ref 30.0–36.0)
MCV: 82.3 fL (ref 80.0–100.0)
Platelets: 353 10*3/uL (ref 150–400)
RBC: 3.79 MIL/uL — ABNORMAL LOW (ref 3.87–5.11)
RDW: 17.3 % — ABNORMAL HIGH (ref 11.5–15.5)
WBC: 13.8 10*3/uL — ABNORMAL HIGH (ref 4.0–10.5)
nRBC: 0 % (ref 0.0–0.2)

## 2020-07-23 LAB — BASIC METABOLIC PANEL
Anion gap: 9 (ref 5–15)
BUN: 6 mg/dL (ref 6–20)
CO2: 25 mmol/L (ref 22–32)
Calcium: 8.7 mg/dL — ABNORMAL LOW (ref 8.9–10.3)
Chloride: 97 mmol/L — ABNORMAL LOW (ref 98–111)
Creatinine, Ser: 0.8 mg/dL (ref 0.44–1.00)
GFR, Estimated: >60 mL/min (ref >60)
Glucose, Bld: 131 mg/dL — ABNORMAL HIGH (ref 70–99)
Potassium: 4 mmol/L (ref 3.5–5.1)
Sodium: 131 mmol/L — ABNORMAL LOW (ref 135–145)

## 2020-07-23 LAB — ABO/RH: ABO/RH(D): O POS

## 2020-07-23 MED ORDER — IBUPROFEN 800 MG PO TABS
800.0000 mg | ORAL_TABLET | Freq: Three times a day (TID) | ORAL | 0 refills | Status: DC
Start: 2020-07-23 — End: 2020-07-31

## 2020-07-23 MED ORDER — OXYCODONE-ACETAMINOPHEN 5-325 MG PO TABS: 1.0000 | ORAL_TABLET | ORAL | 0 refills | Status: AC | PRN

## 2020-07-23 MED ORDER — SODIUM CHLORIDE 0.9 % IV SOLN
25.0000 mg | Freq: Once | INTRAVENOUS | Status: AC
  Administered 2020-07-23: 25 mg via INTRAVENOUS
  Filled 2020-07-23: qty 1

## 2020-07-23 MED ORDER — DIPHENHYDRAMINE HCL 50 MG/ML IJ SOLN: 25.0000 mg | Freq: Four times a day (QID) | INTRAMUSCULAR | Status: DC | PRN

## 2020-07-23 NOTE — Discharge Summary (Signed)
Physician Discharge Summary  Patient ID: Shari Smith MRN: [id number] DOB/AGE: Jan 11, 1971 49 y.o.  Admit date: 07/22/2020 Discharge date: 07/23/2020  Admission Diagnoses: Uterine Fibroids  Discharge Diagnoses:  Active Problems:   Post-operative state   Discharged Condition: good  Hospital Course: Ms Shehata was admitted with above Dx. She underwent TVH with BS without problems. See OP note for additional information. Post op course was unremarkable, she progressed to ambulating, voiding, passing flatus, tolerating diet and good oral pain control. Felt amendable for discharge home. Discharge instructions, medications and follow up were reviewed with pt. She verbalized understanding.   Consults: None  Significant Diagnostic Studies: labs  Treatments: surgery: TVH/BS  Discharge Exam: Blood pressure (!) 148/80, pulse 97, temperature 99.2 F (37.3 C), temperature source Oral, resp. rate 19, height 5\' 6"  (1.676 m), weight 135.6 kg, last menstrual period 07/08/2020, SpO2 100 %.  Lungs clear Heart RRR Abd soft + BS  GU scant blood Ext non tender  Disposition: Discharge disposition: 01-Home or Self Care       Discharge Instructions    Call MD for:  difficulty breathing, headache or visual disturbances   Complete by: As directed    Call MD for:  extreme fatigue   Complete by: As directed    Call MD for:  hives   Complete by: As directed    Call MD for:  persistant dizziness or light-headedness   Complete by: As directed    Call MD for:  persistant nausea and vomiting   Complete by: As directed    Call MD for:  redness, tenderness, or signs of infection (pain, swelling, redness, odor or green/yellow discharge around incision site)   Complete by: As directed    Call MD for:  severe uncontrolled pain   Complete by: As directed    Diet - low sodium heart healthy   Complete by: As directed    Increase activity slowly   Complete by: As directed    Sexual Activity  Restrictions   Complete by: As directed    Pelvic rest x 4 weeks     Allergies as of 07/23/2020      Reactions   Lisinopril Swelling   Patient with lip swelling that might be associated with her use of lisinopril therefore this medication will be discontinued   Strawberry Extract Hives   Strawberry ice cream allergy      Medication List    STOP taking these medications   acetaminophen 325 MG tablet Commonly known as: TYLENOL   diclofenac 75 MG EC tablet Commonly known as: VOLTAREN   furosemide 20 MG tablet Commonly known as: LASIX   meloxicam 7.5 MG tablet Commonly known as: MOBIC   oxyCODONE-acetaminophen 10-325 MG tablet Commonly known as: PERCOCET Replaced by: oxyCODONE-acetaminophen 5-325 MG tablet   oxyCODONE-acetaminophen 7.5-325 MG tablet Commonly known as: PERCOCET     TAKE these medications   atenolol 50 MG tablet Commonly known as: TENORMIN Take 50 mg by mouth daily.   bumetanide 0.5 MG tablet Commonly known as: BUMEX Take 0.5 mg by mouth daily.   esomeprazole 40 MG capsule Commonly known as: NEXIUM Take 40 mg by mouth daily at 12 noon.   ibuprofen 800 MG tablet Commonly known as: ADVIL Take 1 tablet (800 mg total) by mouth every 8 (eight) hours.   Linzess 72 MCG capsule Generic drug: linaclotide Take 72 mcg by mouth daily before breakfast.   oxyCODONE-acetaminophen 5-325 MG tablet Commonly known as: PERCOCET/ROXICET Take 1 tablet by mouth  every 4 (four) hours as needed for moderate pain. Replaces: oxyCODONE-acetaminophen 10-325 MG tablet   traZODone 150 MG tablet Commonly known as: DESYREL Take 150 mg by mouth at bedtime.       Follow-up [place] for [facility] In 4 weeks.   Specialty: Obstetrics and Gynecology Why: Post op appt with Dr. Gardiner Fanti Contact information: [place] [id number] [phone]              Signed: Chancy Milroy 07/23/2020, 8:16 AM

## 2020-07-23 NOTE — Progress Notes (Signed)
Discharge instructions and prescriptions given to pt. Discussed post vaginal hysterectomy care, signs and symptoms to report to the MD, upcoming appointments, and meds. Pt verbalizes understanding and has no questions or concerns at this time. Pt discharged home from hospital in stable condition. 

## 2020-07-24 ENCOUNTER — Telehealth: Payer: Self-pay

## 2020-07-24 LAB — SURGICAL PATHOLOGY

## 2020-07-24 NOTE — Telephone Encounter (Signed)
Transition Care Management Follow-up Telephone Call  Date of discharge and from where: 07/23/2020, [facility]   How have you been since you were released from the hospital? She stated she is doing well, just sore.   Any questions or concerns? Yes -she said that the provider told her that they were sending a prescription for a stool softener to her pharmacy but she did not receive it. Instructed her to call Dr Marjory Lies office and she said she would call.   Items Reviewed:  Did the pt receive and understand the discharge instructions provided? she said she could not find the discharge instructions.  Exlplained to her that she can pick up a copy at [facility] if needed.  She can also contact her surgeon for instructions./questions  Medications obtained and verified? she said she has all medications and did not have any questions about her med regime. reminded her of the importance of finding her medication list or obtaining another copy.  She has medications that she is supposed to stop taking and she said she understood.   Other? No   Any new allergies since your discharge? No   Do you have support at home? Yes , her sons  [place] and Equipment/Supplies: Were home health services ordered?  no If so, what is the name of the agency? n/a Has the agency set up a time to come to the patient's home? n/a Were any new equipment or medical supplies ordered?  No What is the name of the medical supply agency? n/a Were you able to get the supplies/equipment?n/a Do you have any questions related to the use of the equipment or supplies? No, n/a  Functional Questionnaire: (I = Independent and D = Dependent) ADLs: independent  Follow up appointments reviewed:   PCP Hospital f/u appt confirmed? she wanted to schedule a follow up for 3 weeks from now - appointment made with Dr Wynetta Emery 08/12/2020   Specialist Hospital f/u appt confirmed? Yes  [facility].    Are transportation  arrangements needed? No   If their condition worsens, is the pt aware to call PCP or go to the Emergency Dept.? Yes  Was the patient provided with contact information for the PCP's office or ED? She has the phone number   Was to pt encouraged to call back with questions or concerns? yes

## 2020-07-31 ENCOUNTER — Inpatient Hospital Stay: Payer: Medicaid Other | Attending: Genetic Counselor | Admitting: Genetic Counselor

## 2020-07-31 ENCOUNTER — Other Ambulatory Visit: Payer: Self-pay | Admitting: Obstetrics and Gynecology

## 2020-07-31 ENCOUNTER — Inpatient Hospital Stay: Payer: Medicaid Other

## 2020-08-11 ENCOUNTER — Other Ambulatory Visit: Payer: Medicaid Other

## 2020-08-11 DIAGNOSIS — M545 Low back pain, unspecified: Secondary | ICD-10-CM | POA: Diagnosis not present

## 2020-08-12 ENCOUNTER — Ambulatory Visit: Payer: Medicaid Other | Admitting: Internal Medicine

## 2020-08-22 ENCOUNTER — Other Ambulatory Visit: Payer: Self-pay | Admitting: Obstetrics

## 2020-08-25 ENCOUNTER — Ambulatory Visit: Payer: Medicaid Other | Admitting: Obstetrics and Gynecology

## 2020-08-27 ENCOUNTER — Telehealth: Payer: Self-pay | Admitting: Obstetrics and Gynecology

## 2020-08-27 NOTE — Telephone Encounter (Signed)
Call returned to pt and her concerns were discussed. She stated that she has had constant abdominal pain since surgery on 11/30. She has been taking both Oxycodone and ibuprofen as prescribed without relief. She voiced concern for possible infection as she has had that experience following surgery in the past. She requested check of her urine for possible bladder infection although she denies dysuria or frequency of urination. Pt has a scheduled appt w/Dr. Alysia Penna on 1/13 for post op follow up. I offered a nurse visit appt for tomorrow @ 0900 and she agreed.

## 2020-08-27 NOTE — Telephone Encounter (Signed)
Pt has PP visit 08-25-20 but no show due to weather. Pt has resch to 09-04-20. Pt states she is still having severe pain and meds are not helping and wants to make sure she doesn't "have infection in my stomach". Pt wants a call back to make sure she doesn't need to come in earlier than next week for issues. thanks

## 2020-08-28 ENCOUNTER — Ambulatory Visit (INDEPENDENT_AMBULATORY_CARE_PROVIDER_SITE_OTHER): Payer: Medicaid Other | Admitting: General Practice

## 2020-08-28 ENCOUNTER — Ambulatory Visit: Payer: Medicaid Other

## 2020-08-28 ENCOUNTER — Other Ambulatory Visit: Payer: Self-pay

## 2020-08-28 DIAGNOSIS — R1084 Generalized abdominal pain: Secondary | ICD-10-CM

## 2020-08-28 DIAGNOSIS — Z20822 Contact with and (suspected) exposure to covid-19: Secondary | ICD-10-CM | POA: Diagnosis not present

## 2020-08-28 LAB — POCT URINALYSIS DIP (DEVICE)
Bilirubin Urine: NEGATIVE
Glucose, UA: NEGATIVE mg/dL
Ketones, ur: NEGATIVE mg/dL
Leukocytes,Ua: NEGATIVE
Nitrite: NEGATIVE
Protein, ur: NEGATIVE mg/dL
Specific Gravity, Urine: 1.02 (ref 1.005–1.030)
Urobilinogen, UA: 0.2 mg/dL (ref 0.0–1.0)
pH: 6 (ref 5.0–8.0)

## 2020-08-28 NOTE — Progress Notes (Signed)
Patient presents to office today reporting generalized abdominal pain/soreness since before surgery but it has worsened in the past 2-3 weeks. She is taking Ibuprofen for pain but reports it doesn't help at all. Denies dysuria or flank pain. Denies incomplete emptying. States she wants to make sure she doesn't have any infections. Reports pain improves temporarily after bowel movement but returns within a half hour. UA is unremarkable. Patient asked about possibility of bacterial infection or infection from surgery. Discussed with patient bacterial infections like BV wouldn't cause abdominal pain. Advised that infections from surgery usually happen fairly soon after and she would also have fever/chills and/or abdominal tenderness/soreness as well. Discussed likelihood is small since the pain is ongoing before surgery. Recommended she follow up with PCP and keep post op appt for next week with Dr Alysia Penna. Patient verbalized understanding.  Chase Caller RN BSN 08/28/20

## 2020-09-04 ENCOUNTER — Other Ambulatory Visit: Payer: Self-pay

## 2020-09-04 ENCOUNTER — Encounter: Payer: Self-pay | Admitting: Obstetrics and Gynecology

## 2020-09-04 ENCOUNTER — Ambulatory Visit (INDEPENDENT_AMBULATORY_CARE_PROVIDER_SITE_OTHER): Payer: Medicaid Other | Admitting: Obstetrics and Gynecology

## 2020-09-04 VITALS — BP 137/100 | HR 67 | Ht 67.0 in | Wt 292.5 lb

## 2020-09-04 DIAGNOSIS — Z90710 Acquired absence of both cervix and uterus: Secondary | ICD-10-CM

## 2020-09-04 DIAGNOSIS — D649 Anemia, unspecified: Secondary | ICD-10-CM | POA: Insufficient documentation

## 2020-09-04 DIAGNOSIS — Z9079 Acquired absence of other genital organ(s): Secondary | ICD-10-CM

## 2020-09-04 DIAGNOSIS — R102 Pelvic and perineal pain: Secondary | ICD-10-CM

## 2020-09-04 DIAGNOSIS — Z9889 Other specified postprocedural states: Secondary | ICD-10-CM

## 2020-09-04 NOTE — Progress Notes (Signed)
Ms Giorgio is here for post op appt. S/P TVH/Bs on 11/30. Pathology reviewed, normal Pt reports problems with bowels, bloated at times, taking laxative. Denies bladder dysfunction Tolerating diet  PE AF BP 137/100 Lungs clear Heart RRR Abd soft + BS obese min tenderness upper quadrants, non tender lower quadrants GU Nl EGBUS, slight white discharge, cuff well healed, no pelvic tenderness or masses  A/P Post op        Anemia        HTN        Abd/pelvic pain  Doing well over all from a GYN surgery standpoint. Will check CBC. Advised to see PCP for HTN and continue taking BP medication. Advised to discontinue laxative use, instructed to ake stool softener and probiotic. Suspect pain is GI related. Pt was made aware prior to surgery that surgery may not eliminate her pain. Will check CT scan for completeness. Had normal CT scan in 2019. If remains normal, will refer back to PCP for further eval. F/U with me as per test results.

## 2020-09-05 ENCOUNTER — Telehealth: Payer: Self-pay

## 2020-09-05 ENCOUNTER — Telehealth: Payer: Self-pay | Admitting: *Deleted

## 2020-09-05 LAB — CBC
Hematocrit: 32.9 % — ABNORMAL LOW (ref 34.0–46.6)
Hemoglobin: 10.1 g/dL — ABNORMAL LOW (ref 11.1–15.9)
MCH: 25.1 pg — ABNORMAL LOW (ref 26.6–33.0)
MCHC: 30.7 g/dL — ABNORMAL LOW (ref 31.5–35.7)
MCV: 82 fL (ref 79–97)
Platelets: 398 10*3/uL (ref 150–450)
RBC: 4.03 x10E6/uL (ref 3.77–5.28)
RDW: 17.4 % — ABNORMAL HIGH (ref 11.7–15.4)
WBC: 7.1 10*3/uL (ref 3.4–10.8)

## 2020-09-05 NOTE — Telephone Encounter (Signed)
Called Pt to advise of scheduled CT Scan on 09/16/20 @ 7:30a, no answer & could not leave a message, will call back at a later time.

## 2020-09-05 NOTE — Telephone Encounter (Addendum)
-----   Message from Chancy Milroy, MD sent at 09/05/2020  8:43 AM EST ----- Please let [name] know that her anemia is continuing to improve but she needs to continue with her iron supplement. Thanks Legrand Como   1/14  1423  Called pt and heard message stating that the call could not be completed @ this time - unable to leave message.

## 2020-09-09 DIAGNOSIS — Z79899 Other long term (current) drug therapy: Secondary | ICD-10-CM | POA: Diagnosis not present

## 2020-09-09 NOTE — Telephone Encounter (Signed)
Called pt; message received stating "call cannot be completed as dialed." Called patient contact; VM left stating I am trying to reach [facility], callback number given. Letter sent with Rip Harbour, MD recommendation and CT appt time/date.

## 2020-09-15 ENCOUNTER — Telehealth: Payer: Self-pay | Admitting: Obstetrics and Gynecology

## 2020-09-15 DIAGNOSIS — R102 Pelvic and perineal pain: Secondary | ICD-10-CM

## 2020-09-15 DIAGNOSIS — R1084 Generalized abdominal pain: Secondary | ICD-10-CM

## 2020-09-15 DIAGNOSIS — Z90710 Acquired absence of both cervix and uterus: Secondary | ICD-10-CM

## 2020-09-15 NOTE — Telephone Encounter (Signed)
Sherry from [facility] states that this order needs to be CT Abd/Pel With ONLY. If you have any questions please call [place] CT [phone].  Pt appt is in the morning at 730am for her CT.

## 2020-09-15 NOTE — Telephone Encounter (Signed)
Correct order for CT has been placed and all necessary corrections have been made to pt's appointment.

## 2020-09-16 ENCOUNTER — Ambulatory Visit (HOSPITAL_COMMUNITY): Admission: RE | Admit: 2020-09-16 | Payer: Medicaid Other | Source: Ambulatory Visit

## 2020-09-24 DIAGNOSIS — Z5181 Encounter for therapeutic drug level monitoring: Secondary | ICD-10-CM | POA: Diagnosis not present

## 2020-10-08 DIAGNOSIS — Z5181 Encounter for therapeutic drug level monitoring: Secondary | ICD-10-CM | POA: Diagnosis not present

## 2020-10-13 DIAGNOSIS — Z79899 Other long term (current) drug therapy: Secondary | ICD-10-CM | POA: Diagnosis not present

## 2020-10-20 DIAGNOSIS — Z5181 Encounter for therapeutic drug level monitoring: Secondary | ICD-10-CM | POA: Diagnosis not present

## 2020-10-24 IMAGING — US US BREAST*R* LIMITED INC AXILLA
1 series · 10 of 10 positions shown · non-contrast
Comparison: Previous exams.

CLINICAL DATA: Screening recall for right breast mass.

EXAM:
DIGITAL DIAGNOSTIC UNILATERAL RIGHT MAMMOGRAM WITH TOMO AND CAD;
ULTRASOUND RIGHT BREAST LIMITED

[Series 1: us breast*right* limited inc axilla · 0.07mm/px · 10 of 10 slices shown]
[im 1/10]
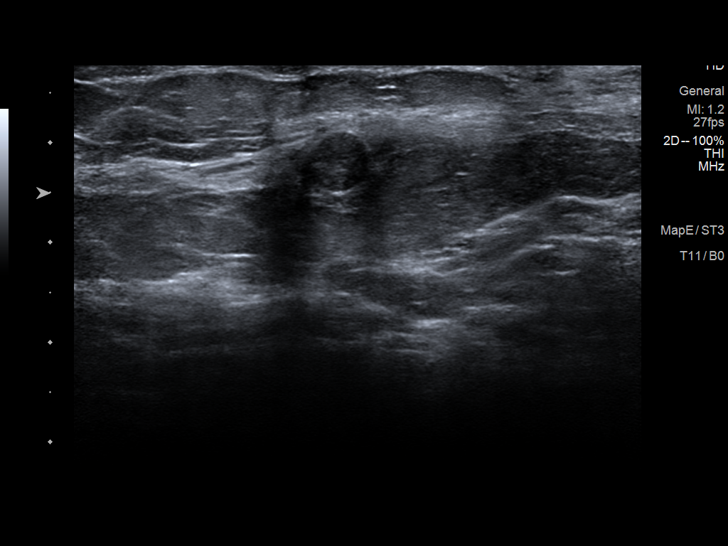
[im 2/10]
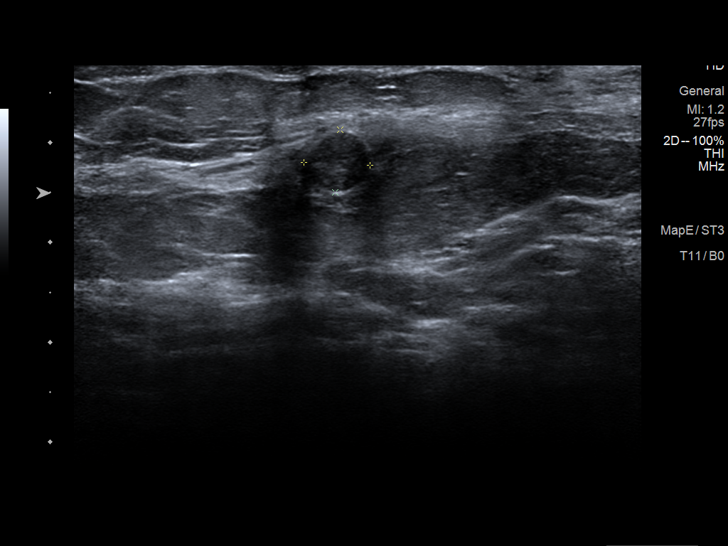
[im 3/10]
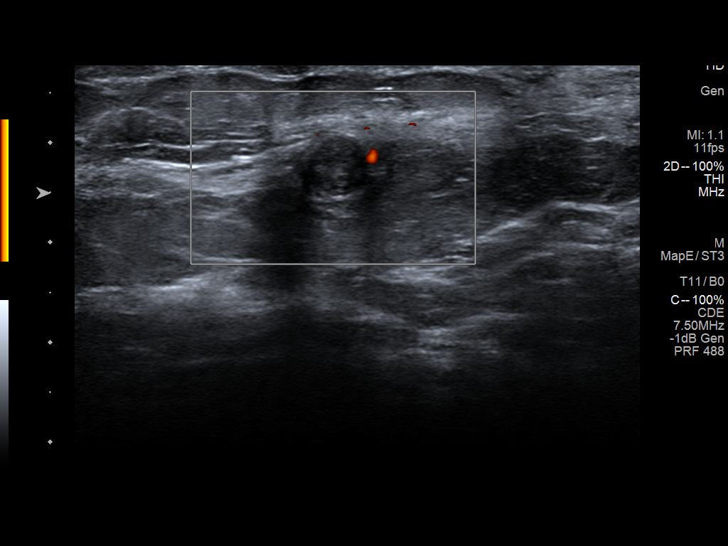
[im 4/10]
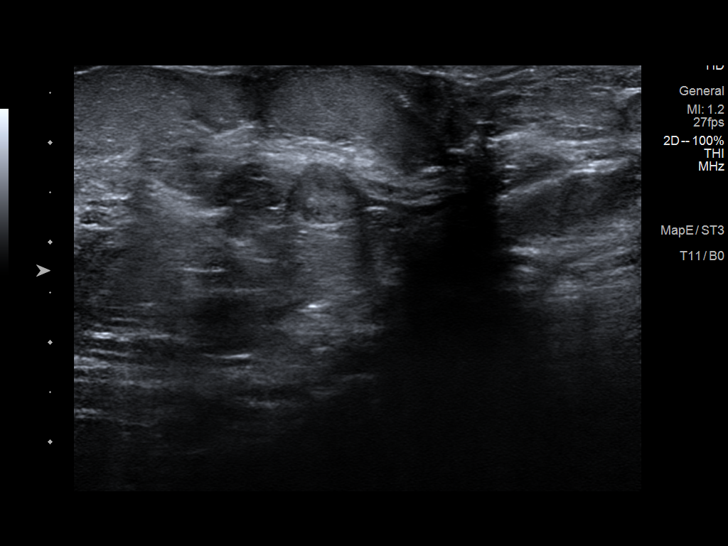
[im 5/10]
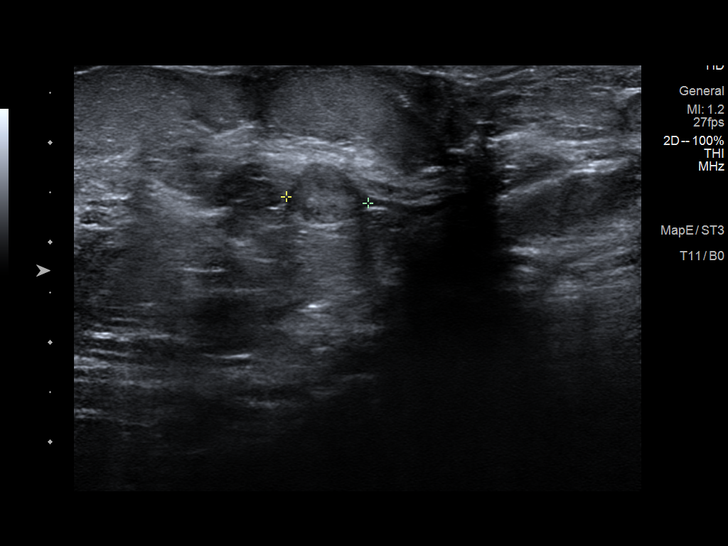
[im 6/10]
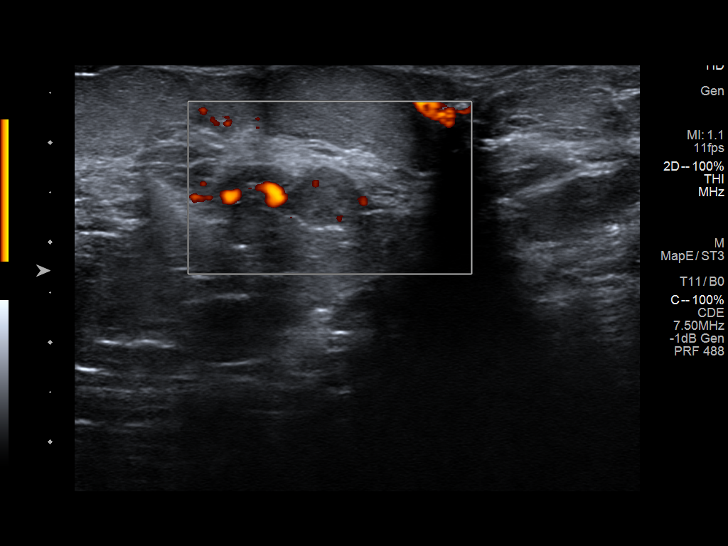
[im 7/10]
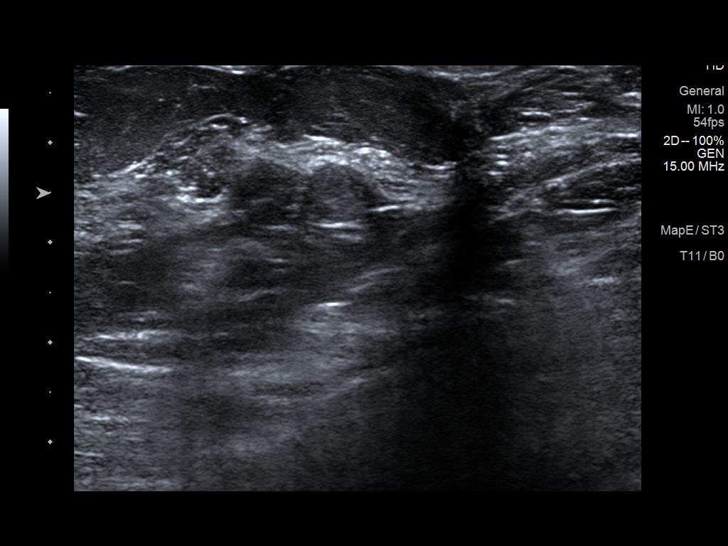
[im 8/10]
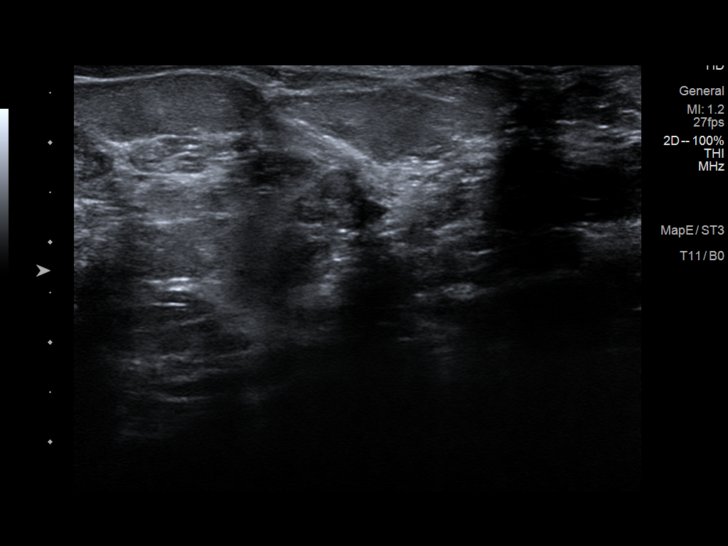
[im 9/10]
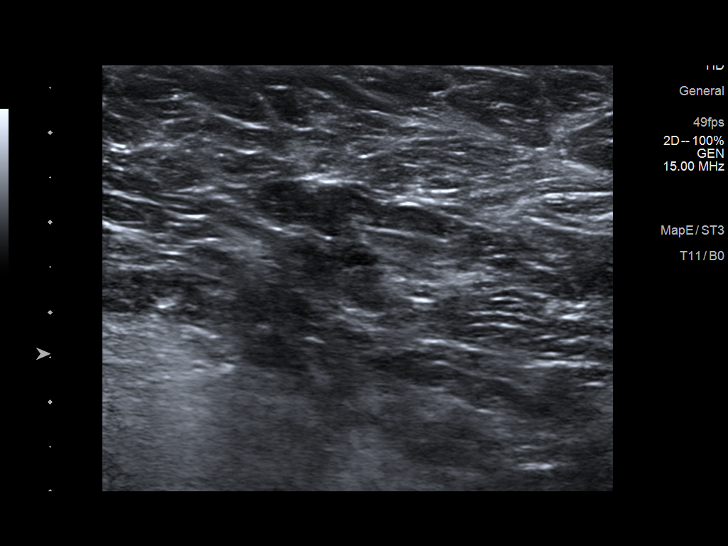
[im 10/10]
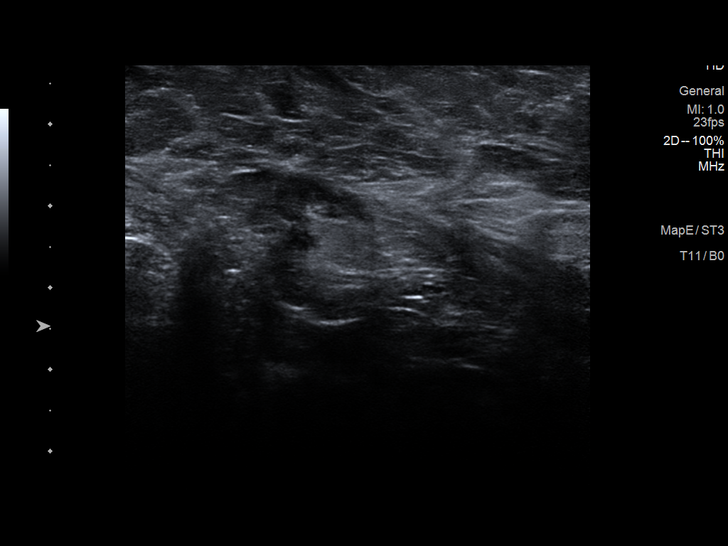

[10 of 10 positions shown; findings below may reference images not displayed]

ACR Breast Density Category c: The breast tissue is heterogeneously
dense, which may obscure small masses.
FINDINGS: Spot compression tomograms were performed over the
central/retroareolar right breast. There is an oval circumscribed
mass measuring approximately 0.8 cm.

Mammographic images were processed with CAD.

Targeted ultrasound of the right breast was performed. There is an
oval mildly hypoechoic mass adjacent to a duct and possibly
intraductal at 8 o'clock 2 cm from nipple. This measures 0.8 x 0.6 x
0.7 cm. This corresponds well with the mass seen in the right breast
at mammography. No lymphadenopathy seen in the right axilla.
IMPRESSION: 0.8 cm likely intraductal mass in the right breast at the 8 o'clock
position.

RECOMMENDATION:
Recommend ultrasound-guided core biopsy of the mass in the right
breast at the 8 o'clock position. This will be scheduled for the
patient.

I have discussed the findings and recommendations with the patient.
If applicable, a reminder letter will be sent to the patient
regarding the next appointment.

BI-RADS CATEGORY  4: Suspicious.

## 2020-11-03 DIAGNOSIS — Z5181 Encounter for therapeutic drug level monitoring: Secondary | ICD-10-CM | POA: Diagnosis not present

## 2020-11-07 IMAGING — US US  BREAST BX W/ LOC DEV 1ST LESION IMG BX SPEC US GUIDE*R*
1 series · 8 of 8 positions shown · non-contrast
Comparison: Previous exam(s).
COMPARISON: Previous exam(s).

Addendum:
CLINICAL DATA: 48-year-old female presenting for biopsy of a right
breast mass.

EXAM:
ULTRASOUND GUIDED RIGHT BREAST CORE NEEDLE BIOPSY

[Series 1: us breast bx w/ loc dev 1st lesion img bx spec us  · 0.07mm/px · 8 of 8 slices shown]
[im 1/8]
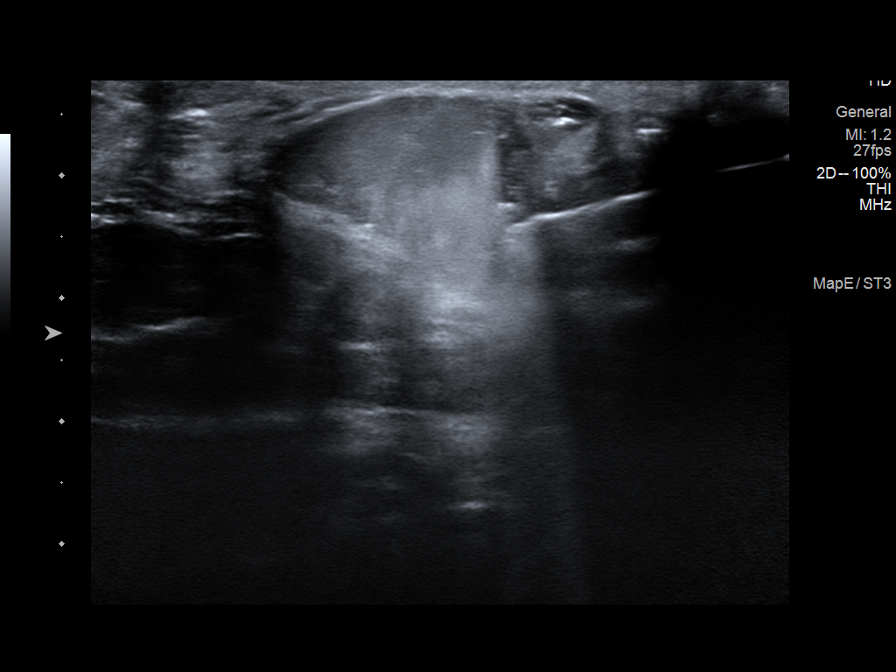
[im 2/8]
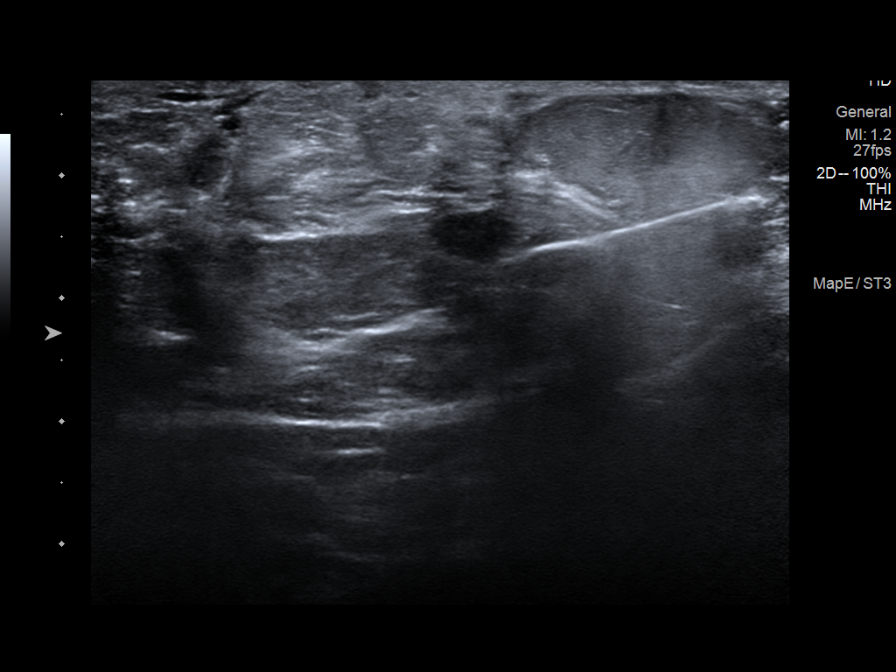
[im 3/8]
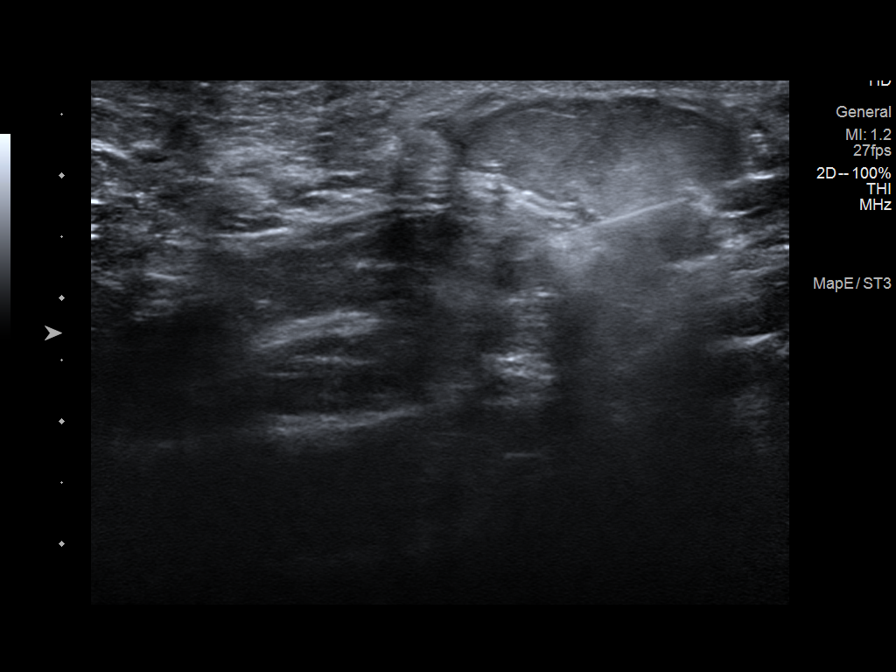
[im 4/8]
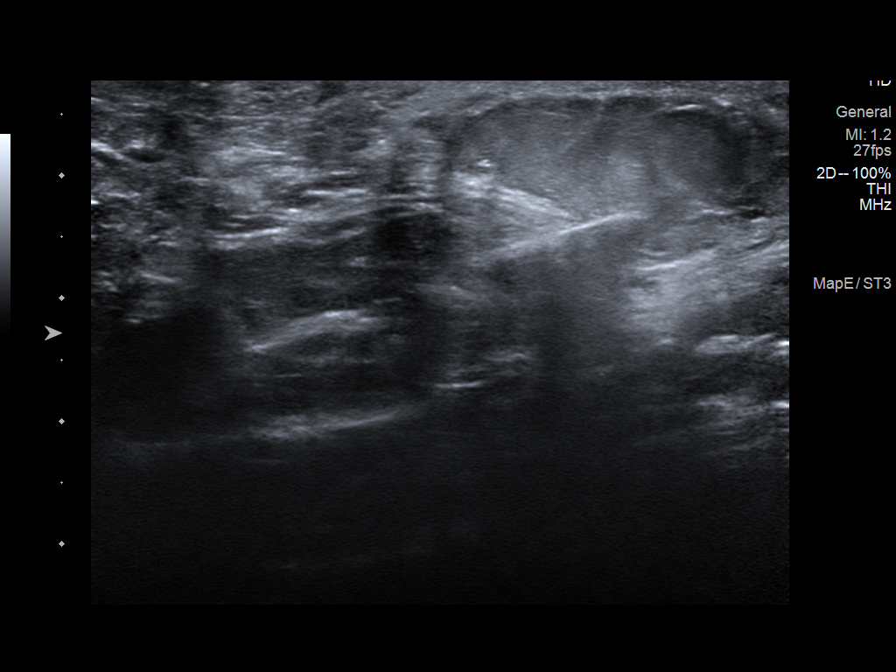
[im 5/8]
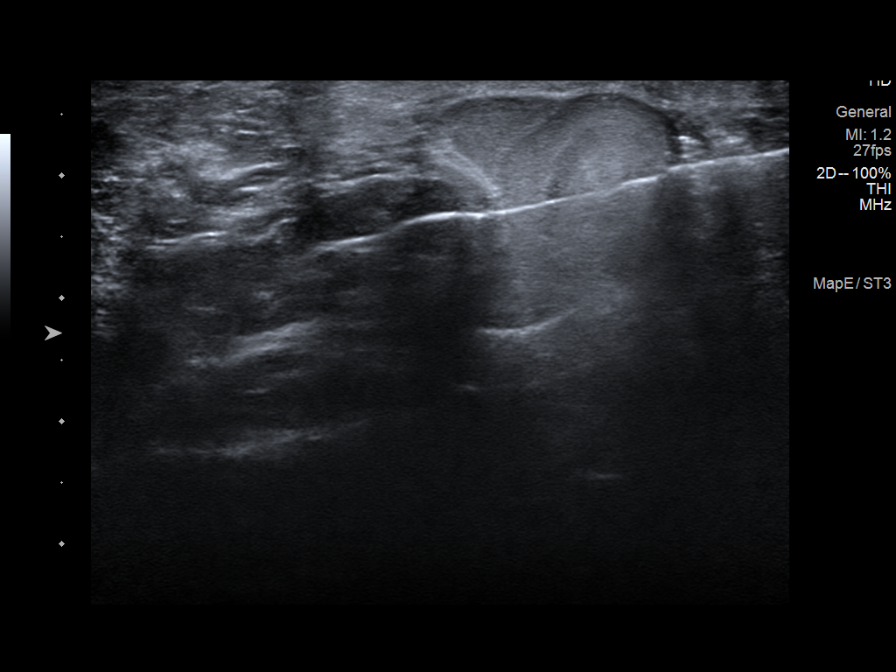
[im 6/8]
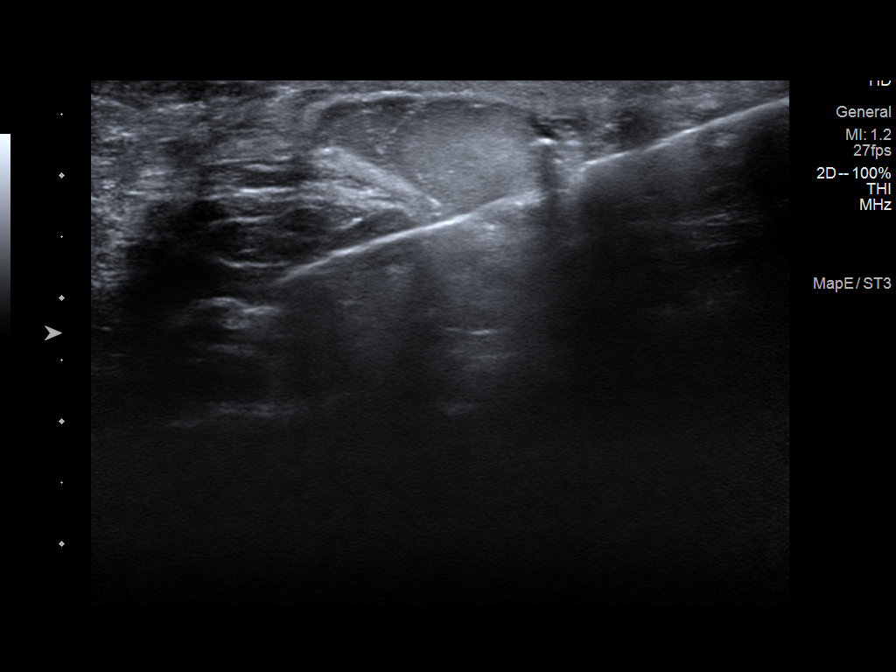
[im 7/8]
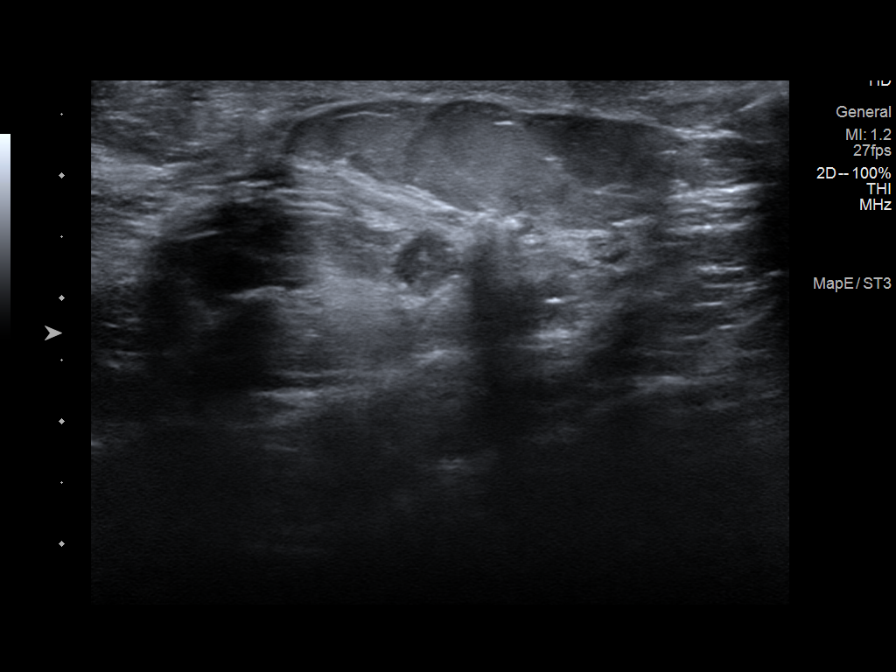
[im 8/8]
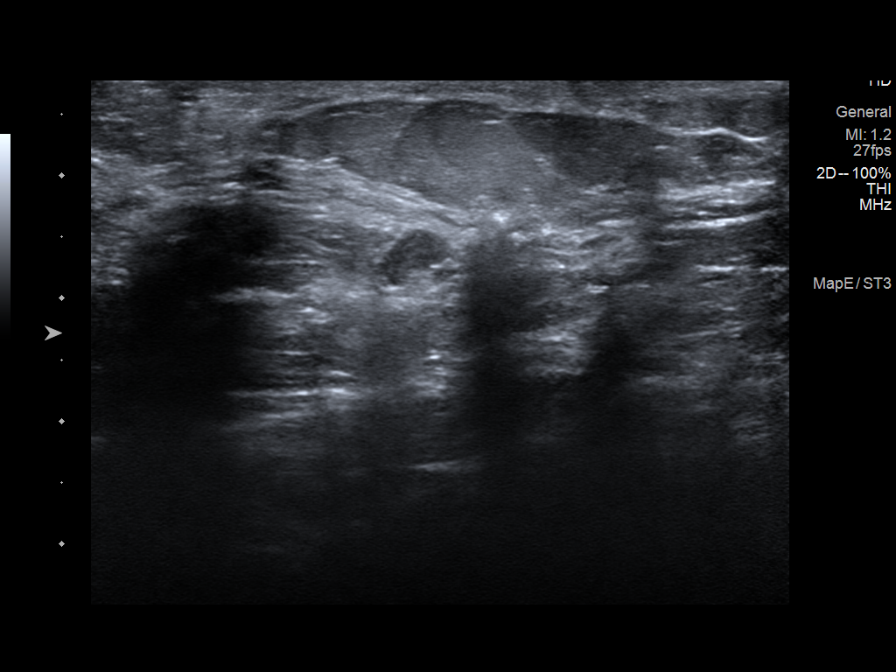

[8 of 8 positions shown; findings below may reference images not displayed]



Lesion quadrant: Lower outer quadrant

Using sterile technique and 1% Lidocaine as local anesthetic, under
direct ultrasound visualization, a 14 gauge Chunghee device was
used to perform biopsy of a right breast mass at 8 o'clock using a
inferior approach. At the conclusion of the procedure two ribbon
tissue marker clips were deployed into the biopsy cavity (there was
concern that the first marking clip had not deployed). Follow up 2
view mammogram was performed and dictated separately.
IMPRESSION: Ultrasound guided biopsy of a right breast mass at 8 o'clock. No
apparent complications.

ADDENDUM:
Pathology revealed INTRADUCTAL PAPILLOMA WITH FOCAL ATYPICAL DUCTAL
HYPERPLASIA of the RIGHT breast, 8 o'clock. This was found to be
concordant by Dr. Himani Hite, with excision recommended.

Pathology results were discussed with the patient by telephone. The
patient reported doing well after the biopsy with tenderness at the
site. Post biopsy instructions and care were reviewed and questions
were answered. The patient was encouraged to call The [REDACTED]

Surgical consultation has been arranged with Dr. Brennan Aujla at
[REDACTED] on July 04, 2020.

Pathology results reported by Akeem Bottoms RN on 06/16/2020.



Lesion quadrant: Lower outer quadrant

Using sterile technique and 1% Lidocaine as local anesthetic, under
direct ultrasound visualization, a 14 gauge Chunghee device was
used to perform biopsy of a right breast mass at 8 o'clock using a
inferior approach. At the conclusion of the procedure two ribbon
tissue marker clips were deployed into the biopsy cavity (there was
concern that the first marking clip had not deployed). Follow up 2
view mammogram was performed and dictated separately.
IMPRESSION: Ultrasound guided biopsy of a right breast mass at 8 o'clock. No
apparent complications.

## 2020-11-07 IMAGING — MG MM BREAST LOCALIZATION CLIP
4 series · 4 of 12 positions shown · non-contrast
Comparison: Previous exam(s).

CLINICAL DATA: Post procedure mammogram for clip placement.

EXAM:
DIAGNOSTIC RIGHT MAMMOGRAM POST ULTRASOUND BIOPSY

[R ML synth-2D]
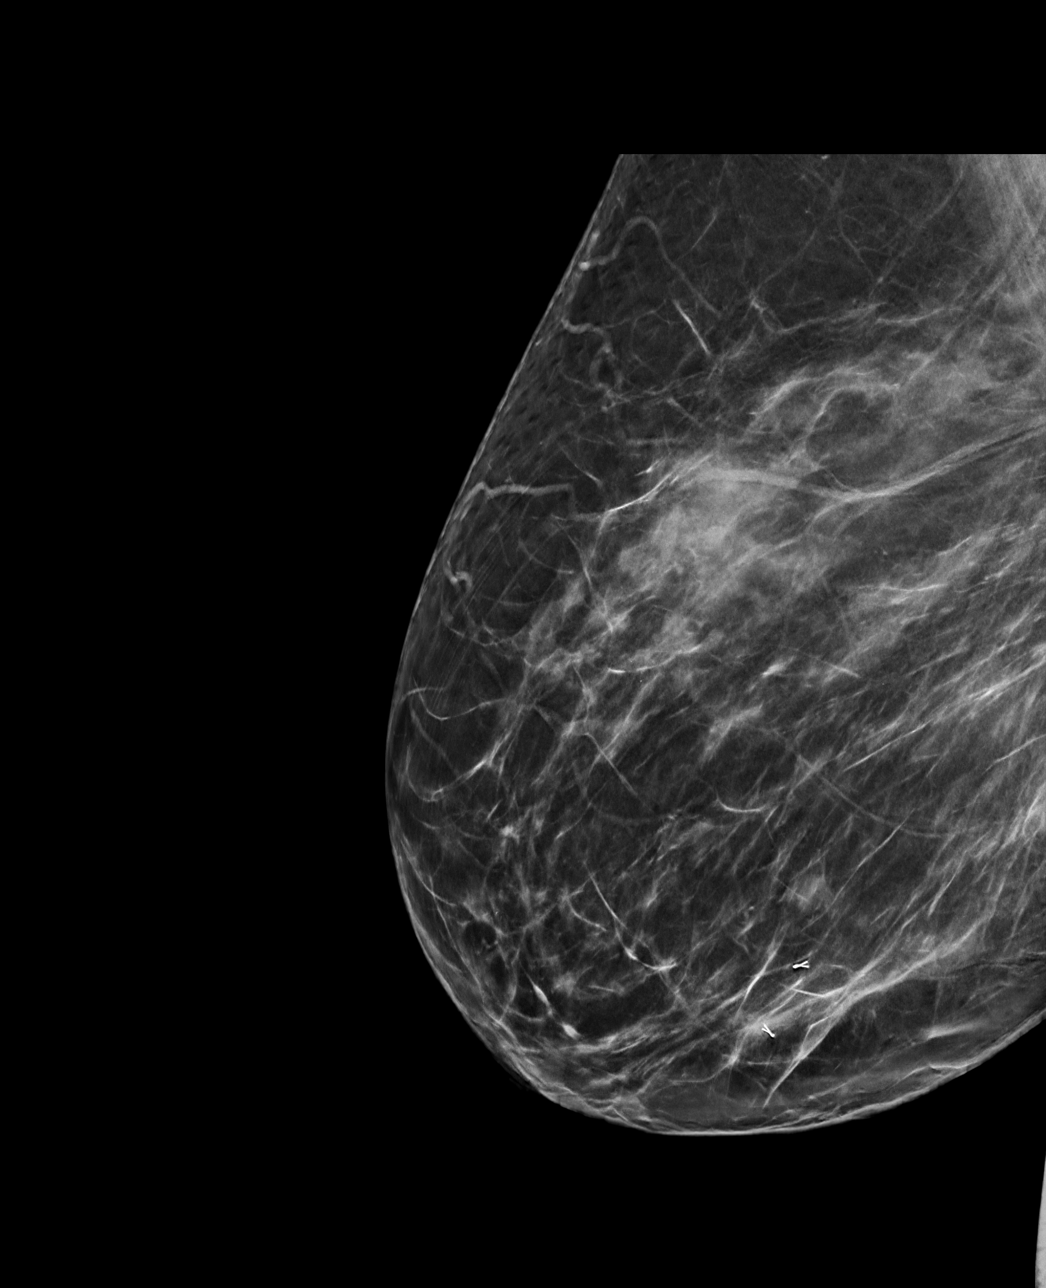

[R CC synth-2D]
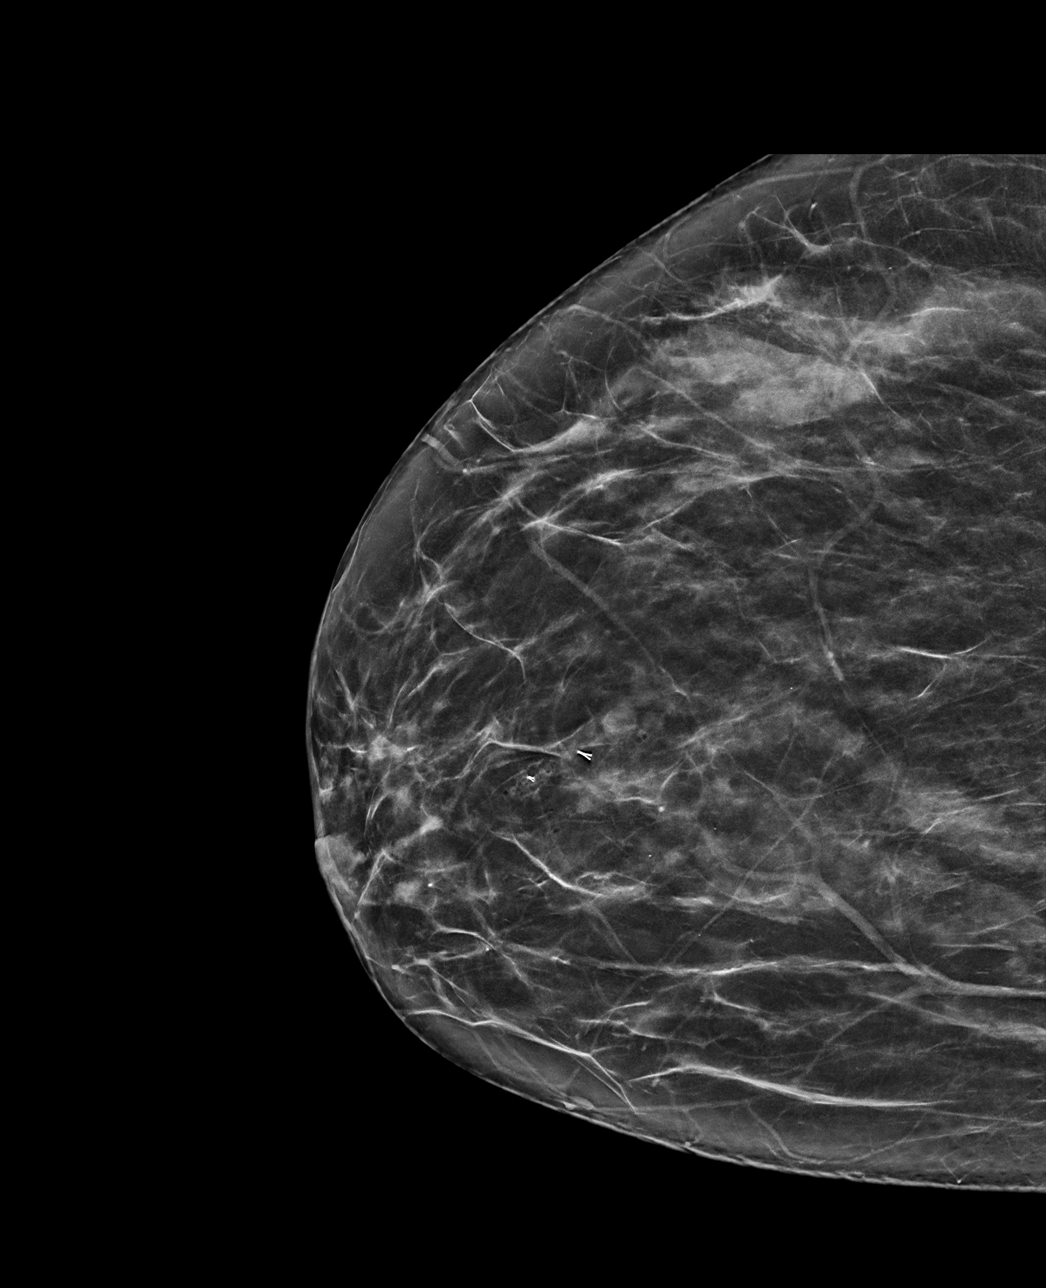

[R CC tomo · tomo slice 35/69.0]
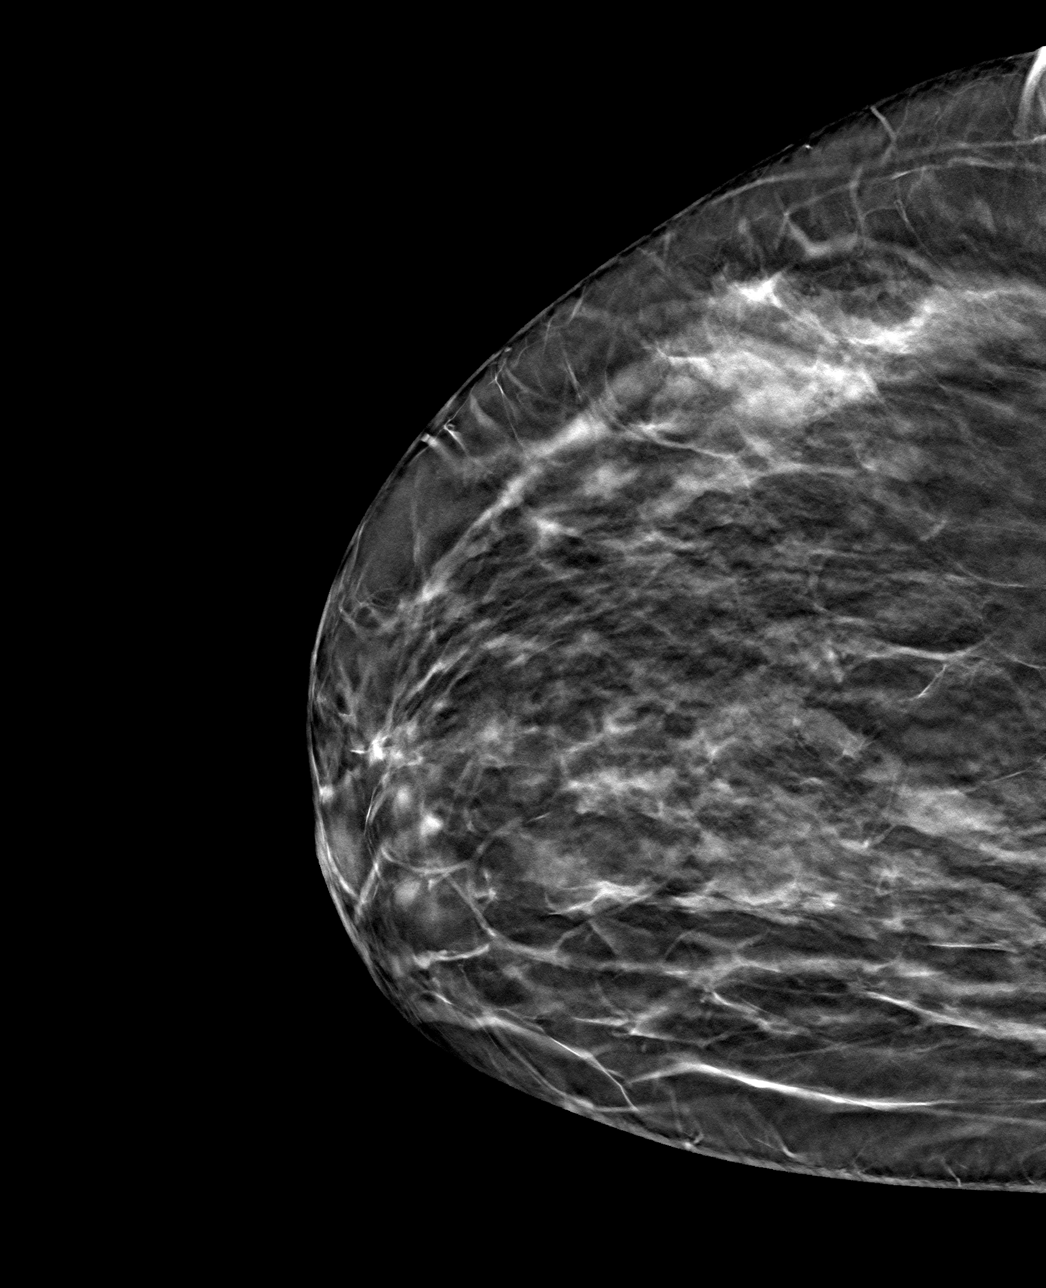

[R ML tomo · tomo slice 41/80.0]
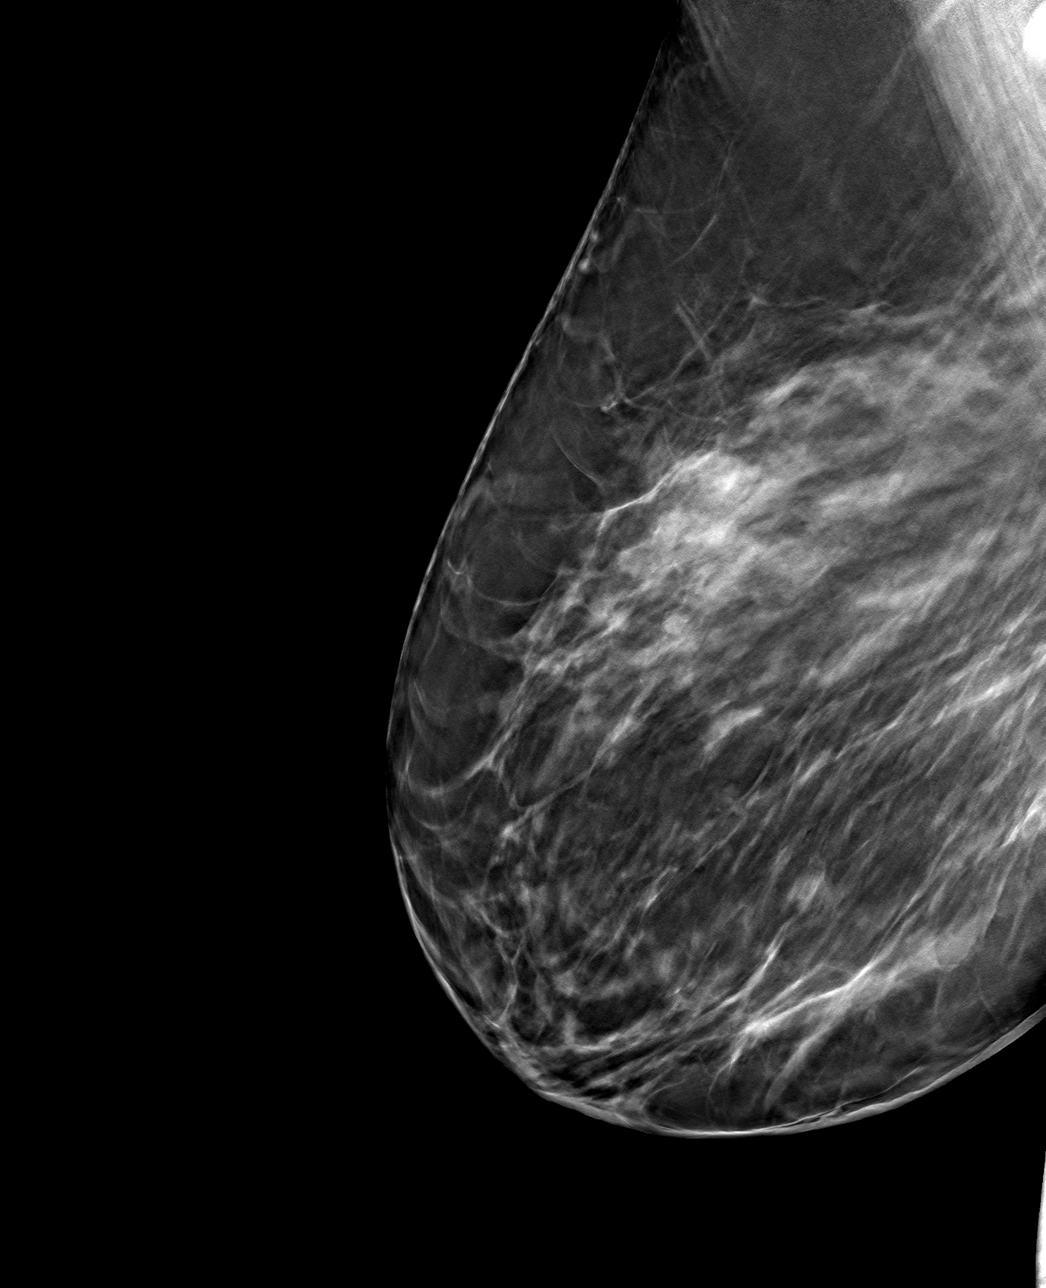

[4 of 12 positions shown; findings below may reference images not displayed]

FINDINGS: Mammographic images were obtained following ultrasound guided biopsy
of a right breast mass at 8 o'clock. Two biopsy rim marking clips
were deployed due to concern that the first clip deployed had failed
or was displaced. The second ribbon biopsy marking clip is closer,
located 1 cm inferior, to the biopsied mass which is still visible.
IMPRESSION: Two ribbon biopsy marking clips were deployed in the right breast at
8 o'clock, as detailed above. There is inferior clip displacement of
approximately 1 cm.

Final Assessment: Post Procedure Mammograms for Marker Placement

## 2020-11-10 DIAGNOSIS — Z79899 Other long term (current) drug therapy: Secondary | ICD-10-CM | POA: Diagnosis not present

## 2020-11-21 DIAGNOSIS — Z5181 Encounter for therapeutic drug level monitoring: Secondary | ICD-10-CM | POA: Diagnosis not present

## 2020-11-26 DIAGNOSIS — R1084 Generalized abdominal pain: Secondary | ICD-10-CM | POA: Diagnosis not present

## 2020-12-09 DIAGNOSIS — M25561 Pain in right knee: Secondary | ICD-10-CM | POA: Diagnosis not present

## 2020-12-09 DIAGNOSIS — G8929 Other chronic pain: Secondary | ICD-10-CM | POA: Diagnosis not present

## 2020-12-09 DIAGNOSIS — M545 Low back pain, unspecified: Secondary | ICD-10-CM | POA: Diagnosis not present

## 2020-12-09 DIAGNOSIS — Z79899 Other long term (current) drug therapy: Secondary | ICD-10-CM | POA: Diagnosis not present

## 2020-12-09 DIAGNOSIS — M25562 Pain in left knee: Secondary | ICD-10-CM | POA: Diagnosis not present

## 2021-01-06 DIAGNOSIS — Z79899 Other long term (current) drug therapy: Secondary | ICD-10-CM | POA: Diagnosis not present

## 2021-01-26 DIAGNOSIS — Z5181 Encounter for therapeutic drug level monitoring: Secondary | ICD-10-CM | POA: Diagnosis not present

## 2021-02-03 DIAGNOSIS — Z79899 Other long term (current) drug therapy: Secondary | ICD-10-CM | POA: Diagnosis not present

## 2021-02-10 DIAGNOSIS — R1084 Generalized abdominal pain: Secondary | ICD-10-CM | POA: Diagnosis not present

## 2021-02-10 DIAGNOSIS — Z1211 Encounter for screening for malignant neoplasm of colon: Secondary | ICD-10-CM | POA: Diagnosis not present

## 2021-02-11 DIAGNOSIS — Z5181 Encounter for therapeutic drug level monitoring: Secondary | ICD-10-CM | POA: Diagnosis not present

## 2021-02-17 DIAGNOSIS — R1084 Generalized abdominal pain: Secondary | ICD-10-CM | POA: Diagnosis not present

## 2021-02-17 DIAGNOSIS — Z01818 Encounter for other preprocedural examination: Secondary | ICD-10-CM | POA: Diagnosis not present

## 2021-02-17 DIAGNOSIS — Z1211 Encounter for screening for malignant neoplasm of colon: Secondary | ICD-10-CM | POA: Diagnosis not present

## 2021-03-03 DIAGNOSIS — Z79899 Other long term (current) drug therapy: Secondary | ICD-10-CM | POA: Diagnosis not present

## 2021-03-03 DIAGNOSIS — K2970 Gastritis, unspecified, without bleeding: Secondary | ICD-10-CM | POA: Diagnosis not present

## 2021-03-04 DIAGNOSIS — Z5181 Encounter for therapeutic drug level monitoring: Secondary | ICD-10-CM | POA: Diagnosis not present

## 2021-03-31 DIAGNOSIS — Z79899 Other long term (current) drug therapy: Secondary | ICD-10-CM | POA: Diagnosis not present

## 2021-04-22 DIAGNOSIS — Z5181 Encounter for therapeutic drug level monitoring: Secondary | ICD-10-CM | POA: Diagnosis not present

## 2021-04-28 DIAGNOSIS — Z79899 Other long term (current) drug therapy: Secondary | ICD-10-CM | POA: Diagnosis not present

## 2021-05-05 DIAGNOSIS — Z5181 Encounter for therapeutic drug level monitoring: Secondary | ICD-10-CM | POA: Diagnosis not present

## 2021-05-19 DIAGNOSIS — Z5181 Encounter for therapeutic drug level monitoring: Secondary | ICD-10-CM | POA: Diagnosis not present

## 2021-05-25 DIAGNOSIS — Z79899 Other long term (current) drug therapy: Secondary | ICD-10-CM | POA: Diagnosis not present

## 2021-05-28 DIAGNOSIS — Z5181 Encounter for therapeutic drug level monitoring: Secondary | ICD-10-CM | POA: Diagnosis not present

## 2021-06-22 DIAGNOSIS — M545 Low back pain, unspecified: Secondary | ICD-10-CM | POA: Diagnosis not present

## 2021-06-22 DIAGNOSIS — E559 Vitamin D deficiency, unspecified: Secondary | ICD-10-CM | POA: Diagnosis not present

## 2021-06-22 DIAGNOSIS — Z1159 Encounter for screening for other viral diseases: Secondary | ICD-10-CM | POA: Diagnosis not present

## 2021-06-22 DIAGNOSIS — Z0000 Encounter for general adult medical examination without abnormal findings: Secondary | ICD-10-CM | POA: Diagnosis not present

## 2021-06-22 DIAGNOSIS — Z79899 Other long term (current) drug therapy: Secondary | ICD-10-CM | POA: Diagnosis not present

## 2021-06-22 DIAGNOSIS — F17210 Nicotine dependence, cigarettes, uncomplicated: Secondary | ICD-10-CM | POA: Diagnosis not present

## 2021-06-22 DIAGNOSIS — R5383 Other fatigue: Secondary | ICD-10-CM | POA: Diagnosis not present

## 2021-07-04 ENCOUNTER — Encounter (HOSPITAL_COMMUNITY): Payer: Self-pay

## 2021-07-04 ENCOUNTER — Other Ambulatory Visit: Payer: Self-pay

## 2021-07-04 ENCOUNTER — Ambulatory Visit (HOSPITAL_COMMUNITY)
Admission: EM | Admit: 2021-07-04 | Discharge: 2021-07-04 | Disposition: A | Discharge status: Home / Self Care | Payer: Medicaid Other | Attending: Physician Assistant | Admitting: Physician Assistant

## 2021-07-04 DIAGNOSIS — R5383 Other fatigue: Secondary | ICD-10-CM

## 2021-07-04 DIAGNOSIS — R0981 Nasal congestion: Secondary | ICD-10-CM

## 2021-07-04 DIAGNOSIS — R059 Cough, unspecified: Secondary | ICD-10-CM

## 2021-07-04 DIAGNOSIS — R6889 Other general symptoms and signs: Secondary | ICD-10-CM | POA: Diagnosis not present

## 2021-07-04 DIAGNOSIS — R519 Headache, unspecified: Secondary | ICD-10-CM

## 2021-07-04 MED ORDER — METOCLOPRAMIDE HCL 5 MG/ML IJ SOLN
10.0000 mg | Freq: Once | INTRAMUSCULAR | Status: AC
  Administered 2021-07-04: 10 mg via INTRAMUSCULAR

## 2021-07-04 MED ORDER — DIPHENHYDRAMINE HCL 25 MG PO CAPS
50.0000 mg | ORAL_CAPSULE | Freq: Once | ORAL | Status: AC
  Administered 2021-07-04: 50 mg via ORAL

## 2021-07-04 MED ORDER — DEXAMETHASONE SODIUM PHOSPHATE 10 MG/ML IJ SOLN
10.0000 mg | Freq: Once | INTRAMUSCULAR | Status: AC
  Administered 2021-07-04: 10 mg via INTRAMUSCULAR

## 2021-07-04 MED ORDER — METOCLOPRAMIDE HCL 5 MG/ML IJ SOLN
INTRAMUSCULAR | Status: AC
  Filled 2021-07-04: qty 2

## 2021-07-04 MED ORDER — DEXAMETHASONE SODIUM PHOSPHATE 10 MG/ML IJ SOLN
INTRAMUSCULAR | Status: AC
  Filled 2021-07-04: qty 1

## 2021-07-04 MED ORDER — DIPHENHYDRAMINE HCL 25 MG PO CAPS
ORAL_CAPSULE | ORAL | Status: AC
  Filled 2021-07-04: qty 2

## 2021-07-04 NOTE — Discharge Instructions (Addendum)
Go to Emergency Department if no improvement or new or worsening symptoms.

## 2021-07-04 NOTE — ED Provider Notes (Signed)
[place]    CSN: [id number] Arrival date & time: 07/04/21  1024      History   Chief Complaint Chief Complaint  Patient presents with   Headache    HPI Shari Smith is a 50 y.o. female.   Patient here c/w HA x 1 day.  She reports son sick with Flu . Admits nasal congestion, rhinorrhea, cough x 3 days.  She has history of migraine.  HA worse then previous migraines.  Located b/l temporal region, does not radiate. Admits phonophobia, denies vision changes, n/t, weakness, chest pain, SOB.  Taking tylenol for pain.  Patient reports allergy to NSAIDs, however, chart notes prior NSAID use.   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   Focal nodular hyperplasia of liver 10/25/2014    Past Surgical History:  Procedure Laterality Date   DILATION AND CURETTAGE OF UTERUS     HERNIA REPAIR     ventral hernia repair   HYSTEROSCOPY WITH D & C N/A 06/22/2018   Procedure: DILATATION AND CURETTAGE /HYSTEROSCOPY PAP SMEAR OF CERIX ENDO CERVICAL CURETTAGE;  Surgeon: [name], MD;  Location: [place];  Service: Gynecology;  Laterality: N/A;   LAPAROSCOPIC CHOLECYSTECTOMY  2010   TUBAL LIGATION     VAGINAL HYSTERECTOMY Bilateral 07/22/2020   Procedure: TOTAL VAGINAL HYSTERECTOMY WITH BILATERAL SALPINGECTOMIES;  Surgeon: Chancy Milroy, MD;  Location: [facility];  Service: Gynecology;  Laterality: Bilateral;   VENTRAL HERNIA REPAIR  2017   with  mesh per patient    OB History   No obstetric history on file.      Home Medications    Prior to Admission medications   Medication Sig Start Date End Date Taking? Authorizing Provider  atenolol (TENORMIN) 50 MG tablet Take 50 mg by mouth daily. 07/15/20   [provider]  bumetanide (BUMEX) 0.5 MG tablet Take 0.5 mg by mouth daily. 07/15/20   [provider]  esomeprazole (NEXIUM) 40 MG capsule Take 40 mg by mouth daily at 12 noon.    [provider]  ibuprofen (ADVIL) 800 MG tablet TAKE 1 TABLET(800 MG) BY MOUTH EVERY 8 HOURS 08/22/20   Shelly Bombard, MD  linaclotide [facility]) 72 MCG capsule Take 72 mcg by mouth daily before breakfast.    [provider]  oxyCODONE-acetaminophen (PERCOCET/ROXICET) 5-325 MG tablet Take 1 tablet by mouth every 4 (four) hours as needed for moderate pain. 07/23/20   Chancy Milroy, MD  traZODone (DESYREL) 150 MG tablet Take 150 mg by mouth at bedtime. 07/15/20   [provider]    Family History Family History  Problem Relation Age of Onset   Hypertension Mother    Cancer Mother        stomach - deceased with disease   Hypertension Father    Breast cancer Sister 12   Seizures Brother  Hypertension Brother    Diabetes Brother    Hypertension Brother    Diabetes Sister     Social History Social History   Tobacco Use   Smoking status: Every Day    Packs/day: 0.25    Years: 10.00    Pack years: 2.50    Types: Cigarettes   Smokeless tobacco: Never   Tobacco comments:    Patient states she smokes 4 cigarettes a day  Vaping Use   Vaping Use: Never used  Substance Use Topics   Alcohol use: Not Currently    Comment: few times a year   Drug use: No     Allergies   Lisinopril and Strawberry extract   Review of Systems Review of Systems  Constitutional:  Positive for fatigue. Negative for chills and fever.  HENT:  Positive for congestion and rhinorrhea. Negative for ear pain,  nosebleeds, postnasal drip, sinus pressure, sinus pain and sore throat.   Eyes:  Negative for pain and redness.  Respiratory:  Positive for cough. Negative for shortness of breath and wheezing.   Gastrointestinal:  Negative for abdominal pain, diarrhea, nausea and vomiting.  Musculoskeletal:  Negative for arthralgias and myalgias.  Skin:  Negative for rash.  Neurological:  Positive for headaches. Negative for dizziness, light-headedness and numbness.  Hematological:  Negative for adenopathy. Does not bruise/bleed easily.  Psychiatric/Behavioral:  Negative for confusion and sleep disturbance.     Physical Exam Triage Vital Signs ED Triage Vitals [07/04/21 1201]  Enc Vitals Group     BP (!) 160/107     Pulse Rate 61     Resp 18     Temp 98.4 F (36.9 C)     Temp Source Oral     SpO2 96 %     Weight      Height      Head Circumference      Peak Flow      Pain Score 10     Pain Loc      Pain Edu?      Excl. in [place]?    No data found.  Updated Vital Signs BP (!) 160/107 (BP Location: Right Wrist)   Pulse 61   Temp 98.4 F (36.9 C) (Oral)   Resp 18   LMP 07/08/2020   SpO2 96%   Visual Acuity Right Eye Distance:   Left Eye Distance:   Bilateral Distance:    Right Eye Near:   Left Eye Near:    Bilateral Near:     Physical Exam Vitals and nursing note reviewed.  Constitutional:      General: She is not in acute distress.    Appearance: Normal appearance. She is not ill-appearing.  HENT:     Head: Normocephalic and atraumatic.     Right Ear: Tympanic membrane and ear canal normal.     Left Ear: Tympanic membrane and ear canal normal.     Nose: No congestion or rhinorrhea.     Mouth/Throat:     Mouth: Mucous membranes are moist.     Pharynx: Oropharynx is clear. No oropharyngeal exudate or posterior oropharyngeal erythema.  Eyes:     General: No scleral icterus.    Extraocular Movements: Extraocular movements intact.     Right eye: Normal extraocular motion and  no nystagmus.     Left eye: Normal extraocular motion and no nystagmus.     Conjunctiva/sclera: Conjunctivae normal.     Pupils: Pupils are equal, round, and reactive to light.  Cardiovascular:  Rate and Rhythm: Normal rate and regular rhythm.     Heart sounds: No murmur heard. Pulmonary:     Effort: Pulmonary effort is normal. No respiratory distress.     Breath sounds: Normal breath sounds. No wheezing or rales.  Musculoskeletal:     Cervical back: Normal range of motion. No rigidity.  Skin:    Coloration: Skin is not jaundiced.     Findings: No rash.  Neurological:     General: No focal deficit present.     Mental Status: She is alert and oriented to person, place, and time.     GCS: GCS eye subscore is 4. GCS verbal subscore is 5. GCS motor subscore is 6.     Cranial Nerves: Cranial nerves 2-12 are intact.     Motor: Motor function is intact. No weakness, abnormal muscle tone or pronator drift.     Gait: Gait normal.     Deep Tendon Reflexes:     Reflex Scores:      Patellar reflexes are 2+ on the right side and 2+ on the left side. Psychiatric:        Mood and Affect: Mood normal.        Behavior: Behavior normal.     UC Treatments / Results  Labs (all labs ordered are listed, but only abnormal results are displayed) Labs Reviewed - No data to display  EKG   Radiology No results found.  Procedures Procedures (including critical care time)  Medications Ordered in UC Medications  dexamethasone (DECADRON) injection 10 mg (10 mg Intramuscular Given 07/04/21 1243)  diphenhydrAMINE (BENADRYL) capsule 50 mg (50 mg Oral Given 07/04/21 1242)  metoCLOPramide (REGLAN) injection 10 mg (10 mg Intramuscular Given 07/04/21 1243)    Initial Impression / Assessment and Plan / UC Course  I have reviewed the triage vital signs and the nursing notes.  Pertinent labs & imaging results that were available during my care of the patient were reviewed by me and considered in my  medical decision making (see chart for details).     Outside treatment window for flu Strict ED precautions provided. Final Clinical Impressions(s) / UC Diagnoses   Final diagnoses:  Acute nonintractable headache, unspecified headache type  Flu-like symptoms     Discharge Instructions      Go to Emergency Department if no improvement or new or worsening symptoms.       ED Prescriptions   None    PDMP not reviewed this encounter.   Shari Jefferson, PA-C 07/04/21 1312

## 2021-07-04 NOTE — ED Triage Notes (Signed)
Pt reports headache and sinus pressure x 3 days.

## 2021-07-09 ENCOUNTER — Encounter (HOSPITAL_COMMUNITY): Payer: Self-pay | Admitting: Emergency Medicine

## 2021-07-09 ENCOUNTER — Other Ambulatory Visit: Payer: Self-pay

## 2021-07-09 ENCOUNTER — Ambulatory Visit (HOSPITAL_COMMUNITY)
Admission: EM | Admit: 2021-07-09 | Discharge: 2021-07-09 | Disposition: A | Discharge status: Home / Self Care | Payer: Medicaid Other | Attending: Internal Medicine | Admitting: Internal Medicine

## 2021-07-09 DIAGNOSIS — K2900 Acute gastritis without bleeding: Secondary | ICD-10-CM

## 2021-07-09 DIAGNOSIS — Z5181 Encounter for therapeutic drug level monitoring: Secondary | ICD-10-CM | POA: Diagnosis not present

## 2021-07-09 MED ORDER — LIDOCAINE VISCOUS HCL 2 % MT SOLN
15.0000 mL | Freq: Once | OROMUCOSAL | Status: AC
  Administered 2021-07-09: 10:00:00 15 mL via ORAL

## 2021-07-09 MED ORDER — PANTOPRAZOLE SODIUM 20 MG PO TBEC: 20.0000 mg | DELAYED_RELEASE_TABLET | Freq: Every day | ORAL | 1 refills | Status: AC

## 2021-07-09 MED ORDER — ALUM & MAG HYDROXIDE-SIMETH 200-200-20 MG/5ML PO SUSP
ORAL | Status: AC
  Filled 2021-07-09: qty 30

## 2021-07-09 MED ORDER — ALUM & MAG HYDROXIDE-SIMETH 200-200-20 MG/5ML PO SUSP
30.0000 mL | Freq: Once | ORAL | Status: AC
  Administered 2021-07-09: 10:00:00 30 mL via ORAL

## 2021-07-09 MED ORDER — LIDOCAINE VISCOUS HCL 2 % MT SOLN
OROMUCOSAL | Status: AC
  Filled 2021-07-09: qty 15

## 2021-07-09 NOTE — ED Triage Notes (Signed)
Pt is present today with abdominal pain due to a hernia and left knee pain. Pt states the last couple of weeks her abdominal pain has worsen. Pt states that her knee pain started a couple weeks ago. Pt denies any injury

## 2021-07-09 NOTE — ED Provider Notes (Signed)
[place]    CSN: [id number] Arrival date & time: 07/09/21  0827      History   Chief Complaint Chief Complaint  Patient presents with   Abdominal Pain    HPI Shari Smith is a 50 y.o. female comes to the urgent care with epigastric pain over the past 3 to 4 days.  Pain is constant, dull and mainly in the epigastric region.  No known aggravating or relieving factors.  Symptoms are not reliably associated with diet.  Patient denies taking NSAIDs on a regular basis.  Patient has a history of gastritis previously managed with omeprazole.  Patient has stopped taking omeprazole for a while now.  No weight loss.  No diarrhea.  Patient has a history of H[place] is status post mesh surgery.  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   Focal nodular hyperplasia of liver 10/25/2014    Past Surgical History:  Procedure Laterality Date   DILATION AND CURETTAGE OF UTERUS     HERNIA REPAIR     ventral hernia repair   HYSTEROSCOPY WITH D & C N/A 06/22/2018   Procedure: DILATATION AND CURETTAGE /HYSTEROSCOPY PAP SMEAR OF CERIX ENDO CERVICAL CURETTAGE;  Surgeon: [name], MD;  Location: [place];  Service: Gynecology;  Laterality: N/A;   LAPAROSCOPIC CHOLECYSTECTOMY  2010   TUBAL LIGATION     VAGINAL HYSTERECTOMY Bilateral 07/22/2020   Procedure: TOTAL VAGINAL HYSTERECTOMY WITH BILATERAL SALPINGECTOMIES;  Surgeon: Chancy Milroy, MD;   Location: [place];  Service: Gynecology;  Laterality: Bilateral;   VENTRAL HERNIA REPAIR  2017   with mesh per patient    OB History   No obstetric history on file.      Home Medications    Prior to Admission medications   Medication Sig Start Date End Date Taking? Authorizing Provider  pantoprazole (PROTONIX) 20 MG tablet Take 1 tablet (20 mg total) by mouth daily. 07/09/21  Yes Sergio Hobart, Myrene Galas, MD  atenolol (TENORMIN) 50 MG tablet Take 50 mg by mouth daily. 07/15/20   [provider]  bumetanide (BUMEX) 0.5 MG tablet Take 0.5 mg by mouth daily. 07/15/20   [provider]  ibuprofen (ADVIL) 800 MG tablet TAKE 1 TABLET(800 MG) BY MOUTH EVERY 8 HOURS 08/22/20   Shelly Bombard, MD  linaclotide [facility]) 72 MCG capsule Take 72 mcg by mouth daily before breakfast.    [provider]  oxyCODONE-acetaminophen (PERCOCET/ROXICET) 5-325 MG tablet Take 1 tablet by mouth every 4 (four) hours as needed for moderate pain. 07/23/20   Chancy Milroy, MD  traZODone (DESYREL) 150 MG tablet Take 150 mg by mouth at bedtime. 07/15/20   [provider]    Family History Family History  Problem Relation Age of Onset   Hypertension Mother    Cancer Mother  stomach - deceased with disease   Hypertension Father    Breast cancer Sister 32   Seizures Brother    Hypertension Brother    Diabetes Brother    Hypertension Brother    Diabetes Sister     Social History Social History   Tobacco Use   Smoking status: Every Day    Packs/day: 0.25    Years: 10.00    Pack years: 2.50    Types: Cigarettes   Smokeless tobacco: Never   Tobacco comments:    Patient states she smokes 4 cigarettes a day  Vaping Use   Vaping Use: Never used  Substance Use Topics   Alcohol use: Not Currently    Comment: few times a year   Drug use: No     Allergies   Lisinopril and Strawberry extract   Review of Systems Review of Systems  Constitutional:  Negative for  unexpected weight change.  Gastrointestinal:  Positive for abdominal pain. Negative for diarrhea, nausea and vomiting.    Physical Exam Triage Vital Signs ED Triage Vitals  Enc Vitals Group     BP 07/09/21 0912 (!) 145/76     Pulse Rate 07/09/21 0910 63     Resp 07/09/21 0910 18     Temp 07/09/21 0910 98.7 F (37.1 C)     Temp src --      SpO2 07/09/21 0910 97 %     Weight --      Height --      Head Circumference --      Peak Flow --      Pain Score 07/09/21 0909 10     Pain Loc --      Pain Edu? --      Excl. in [place]? --    No data found.  Updated Vital Signs BP (!) 145/76   Pulse 63   Temp 98.7 F (37.1 C)   Resp 18   LMP 07/08/2020   SpO2 97%   Visual Acuity Right Eye Distance:   Left Eye Distance:   Bilateral Distance:    Right Eye Near:   Left Eye Near:    Bilateral Near:     Physical Exam Vitals and nursing note reviewed.  Constitutional:      General: She is not in acute distress.    Appearance: She is not ill-appearing.  Cardiovascular:     Rate and Rhythm: Normal rate and regular rhythm.  Abdominal:     Palpations: Abdomen is soft.     Tenderness: There is abdominal tenderness in the epigastric area. There is no guarding or rebound.     Hernia: No hernia is present.  Neurological:     Mental Status: She is alert.     UC Treatments / Results  Labs (all labs ordered are listed, but only abnormal results are displayed) Labs Reviewed - No data to display  EKG   Radiology No results found.  Procedures Procedures (including critical care time)  Medications Ordered in UC Medications  alum & mag hydroxide-simeth (MAALOX/MYLANTA) 200-200-20 MG/5ML suspension 30 mL (30 mLs Oral Given 07/09/21 0955)    And  lidocaine (XYLOCAINE) 2 % viscous mouth solution 15 mL (15 mLs Oral Given 07/09/21 0955)    Initial Impression / Assessment and Plan / UC Course  I have reviewed the triage vital signs and the nursing notes.  Pertinent labs & imaging  results that were available during my care of the patient were reviewed by me and considered  in my medical decision making (see chart for details).     1.  Acute superficial gastritis without hemorrhage: GI cocktail provided some partial relief Protonix 20 mg orally daily If symptoms worsen please return to urgent care to be reevaluated If symptoms persist after 30 days of taking Protonix, patient will benefit from gastroenterology evaluation. Final Clinical Impressions(s) / UC Diagnoses   Final diagnoses:  Acute superficial gastritis without hemorrhage     Discharge Instructions      This take medications as prescribed If symptoms worsen please return to urgent care to be reevaluated. If symptoms does not improve after 1 month of therapy, you will benefit from gastroenterology evaluation.   ED Prescriptions     Medication Sig Dispense Auth. Provider   pantoprazole (PROTONIX) 20 MG tablet Take 1 tablet (20 mg total) by mouth daily. 30 tablet Patricia Perales, Myrene Galas, MD      PDMP not reviewed this encounter.   Chase Picket, MD 07/09/21 1048

## 2021-07-09 NOTE — Discharge Instructions (Addendum)
This take medications as prescribed If symptoms worsen please return to urgent care to be reevaluated. If symptoms does not improve after 1 month of therapy, you will benefit from gastroenterology evaluation.

## 2021-07-20 DIAGNOSIS — Z79899 Other long term (current) drug therapy: Secondary | ICD-10-CM | POA: Diagnosis not present

## 2021-07-30 DIAGNOSIS — Z6841 Body Mass Index (BMI) 40.0 and over, adult: Secondary | ICD-10-CM | POA: Diagnosis not present

## 2021-07-30 DIAGNOSIS — T7840XA Allergy, unspecified, initial encounter: Secondary | ICD-10-CM | POA: Diagnosis not present

## 2021-07-30 DIAGNOSIS — L500 Allergic urticaria: Secondary | ICD-10-CM | POA: Diagnosis not present

## 2021-07-30 DIAGNOSIS — R232 Flushing: Secondary | ICD-10-CM | POA: Diagnosis not present

## 2021-07-30 DIAGNOSIS — I10 Essential (primary) hypertension: Secondary | ICD-10-CM | POA: Diagnosis not present

## 2021-08-04 DIAGNOSIS — Z5181 Encounter for therapeutic drug level monitoring: Secondary | ICD-10-CM | POA: Diagnosis not present

## 2021-08-19 DIAGNOSIS — Z1211 Encounter for screening for malignant neoplasm of colon: Secondary | ICD-10-CM | POA: Diagnosis not present

## 2021-08-19 DIAGNOSIS — R1030 Lower abdominal pain, unspecified: Secondary | ICD-10-CM | POA: Diagnosis not present

## 2021-09-04 DIAGNOSIS — Z1211 Encounter for screening for malignant neoplasm of colon: Secondary | ICD-10-CM | POA: Diagnosis not present

## 2021-09-10 DIAGNOSIS — K635 Polyp of colon: Secondary | ICD-10-CM | POA: Diagnosis not present

## 2021-09-14 DIAGNOSIS — Z79899 Other long term (current) drug therapy: Secondary | ICD-10-CM | POA: Diagnosis not present

## 2021-09-16 DIAGNOSIS — Z79899 Other long term (current) drug therapy: Secondary | ICD-10-CM | POA: Diagnosis not present

## 2021-10-02 DIAGNOSIS — R1084 Generalized abdominal pain: Secondary | ICD-10-CM | POA: Diagnosis not present

## 2021-10-02 DIAGNOSIS — K5790 Diverticulosis of intestine, part unspecified, without perforation or abscess without bleeding: Secondary | ICD-10-CM | POA: Diagnosis not present

## 2021-10-02 DIAGNOSIS — Z6841 Body Mass Index (BMI) 40.0 and over, adult: Secondary | ICD-10-CM | POA: Diagnosis not present

## 2021-10-02 DIAGNOSIS — Z86010 Personal history of colonic polyps: Secondary | ICD-10-CM | POA: Diagnosis not present

## 2021-10-02 DIAGNOSIS — K648 Other hemorrhoids: Secondary | ICD-10-CM | POA: Diagnosis not present

## 2021-10-12 DIAGNOSIS — Z79899 Other long term (current) drug therapy: Secondary | ICD-10-CM | POA: Diagnosis not present

## 2021-11-09 DIAGNOSIS — Z79899 Other long term (current) drug therapy: Secondary | ICD-10-CM | POA: Diagnosis not present

## 2021-12-06 DIAGNOSIS — Z79899 Other long term (current) drug therapy: Secondary | ICD-10-CM | POA: Diagnosis not present

## 2022-01-04 DIAGNOSIS — Z79899 Other long term (current) drug therapy: Secondary | ICD-10-CM | POA: Diagnosis not present

## 2022-02-01 DIAGNOSIS — Z79899 Other long term (current) drug therapy: Secondary | ICD-10-CM | POA: Diagnosis not present

## 2022-02-11 ENCOUNTER — Ambulatory Visit
Admission: EM | Admit: 2022-02-11 | Discharge: 2022-02-11 | Disposition: A | Discharge status: Home / Self Care | Payer: Medicaid Other | Attending: Internal Medicine | Admitting: Internal Medicine

## 2022-02-11 DIAGNOSIS — J069 Acute upper respiratory infection, unspecified: Secondary | ICD-10-CM

## 2022-02-11 DIAGNOSIS — H65192 Other acute nonsuppurative otitis media, left ear: Secondary | ICD-10-CM

## 2022-02-11 MED ORDER — AMOXICILLIN-POT CLAVULANATE 875-125 MG PO TABS: 1.0000 | ORAL_TABLET | Freq: Two times a day (BID) | ORAL | 0 refills | Status: DC

## 2022-02-11 NOTE — ED Triage Notes (Signed)
Pt c/o bilateral ear pressure and hearing an "echo" when she talks onset ~ Monday

## 2022-02-11 NOTE — ED Provider Notes (Addendum)
[facility]    CSN: [id number] Arrival date & time: 02/11/22  1446      History   Chief Complaint Chief Complaint  Patient presents with   ear pressure    HPI GRACEY TOLLE is a 51 y.o. female.   Patient presents with nasal congestion, sinus pressure, and bilateral ear discomfort that has been present for about 4 days. Denies any known fevers or sick contacts. Denies chest pain, shortness of breath, sore throat, nausea, vomiting, diarrhea, abdominal pain.  Patient has not taken any medications to help alleviate symptoms.  Patient also has mildly elevated blood pressure reading and reports that she has been taking her medications as prescribed.  Denies headache, dizziness, blurred vision.     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   Focal nodular hyperplasia of liver 10/25/2014    Past Surgical History:  Procedure Laterality Date   DILATION AND CURETTAGE OF UTERUS     HERNIA REPAIR     ventral hernia repair   HYSTEROSCOPY WITH D & C N/A 06/22/2018   Procedure: DILATATION AND CURETTAGE /HYSTEROSCOPY PAP SMEAR OF CERIX ENDO CERVICAL CURETTAGE;  Surgeon: [name], MD;  Location: [place];  Service: Gynecology;  Laterality: N/A;   LAPAROSCOPIC CHOLECYSTECTOMY  2010   TUBAL LIGATION     VAGINAL HYSTERECTOMY Bilateral 07/22/2020   Procedure: TOTAL VAGINAL HYSTERECTOMY WITH BILATERAL SALPINGECTOMIES;  Surgeon: Chancy Milroy,  MD;  Location: [place];  Service: Gynecology;  Laterality: Bilateral;   VENTRAL HERNIA REPAIR  2017   with mesh per patient    OB History   No obstetric history on file.      Home Medications    Prior to Admission medications   Medication Sig Start Date End Date Taking? Authorizing Provider  amoxicillin-clavulanate (AUGMENTIN) 875-125 MG tablet Take 1 tablet by mouth every 12 (twelve) hours. 02/11/22  Yes Leza Apsey, Hildred Alamin E, FNP  atenolol (TENORMIN) 50 MG tablet Take 50 mg by mouth daily. 07/15/20   [provider]  bumetanide (BUMEX) 0.5 MG tablet Take 0.5 mg by mouth daily. 07/15/20   [provider]  ibuprofen (ADVIL) 800 MG tablet TAKE 1 TABLET(800 MG) BY MOUTH EVERY 8 HOURS 08/22/20   Shelly Bombard, MD  linaclotide [facility]) 72 MCG capsule Take 72 mcg by mouth daily before breakfast.    [provider]  oxyCODONE-acetaminophen (PERCOCET/ROXICET) 5-325 MG tablet Take 1 tablet by mouth every 4 (four) hours as needed for moderate pain. 07/23/20   Chancy Milroy, MD  pantoprazole (PROTONIX) 20 MG tablet Take 1 tablet (20 mg total) by mouth daily. 07/09/21   Chase Picket, MD  traZODone (DESYREL) 150 MG tablet Take 150 mg by mouth at bedtime. 07/15/20   [provider]    Family History Family History  Problem Relation Age of Onset   Hypertension Mother  Cancer Mother        stomach - deceased with disease   Hypertension Father    Breast cancer Sister 16   Seizures Brother    Hypertension Brother    Diabetes Brother    Hypertension Brother    Diabetes Sister     Social History Social History   Tobacco Use   Smoking status: Every Day    Packs/day: 0.25    Years: 10.00    Total pack years: 2.50    Types: Cigarettes   Smokeless tobacco: Never   Tobacco comments:    Patient states she smokes 4 cigarettes a day  Vaping Use   Vaping Use: Never used  Substance Use Topics   Alcohol use: Not Currently    Comment: few times a year    Drug use: No     Allergies   Lisinopril and Strawberry extract   Review of Systems Review of Systems Per HPI  Physical Exam Triage Vital Signs ED Triage Vitals  Enc Vitals Group     BP 02/11/22 1504 (!) 150/100     Pulse Rate 02/11/22 1504 70     Resp 02/11/22 1504 18     Temp 02/11/22 1504 98.5 F (36.9 C)     Temp Source 02/11/22 1504 Oral     SpO2 02/11/22 1504 97 %     Weight --      Height --      Head Circumference --      Peak Flow --      Pain Score 02/11/22 1505 0     Pain Loc --      Pain Edu? --      Excl. in [place]? --    No data found.  Updated Vital Signs BP (!) 150/100 (BP Location: Left Arm)   Pulse 70   Temp 98.5 F (36.9 C) (Oral)   Resp 18   LMP 07/08/2020   SpO2 97%   Visual Acuity Right Eye Distance:   Left Eye Distance:   Bilateral Distance:    Right Eye Near:   Left Eye Near:    Bilateral Near:     Physical Exam Constitutional:      General: She is not in acute distress.    Appearance: Normal appearance. She is not toxic-appearing or diaphoretic.  HENT:     Head: Normocephalic and atraumatic.     Right Ear: Tympanic membrane and ear canal normal.     Left Ear: Ear canal normal. Tympanic membrane is erythematous. Tympanic membrane is not perforated or bulging.     Nose: Congestion present.     Mouth/Throat:     Mouth: Mucous membranes are moist.     Pharynx: No posterior oropharyngeal erythema.  Eyes:     Extraocular Movements: Extraocular movements intact.     Conjunctiva/sclera: Conjunctivae normal.     Pupils: Pupils are equal, round, and reactive to light.  Cardiovascular:     Rate and Rhythm: Normal rate and regular rhythm.     Pulses: Normal pulses.     Heart sounds: Normal heart sounds.  Pulmonary:     Effort: Pulmonary effort is normal. No respiratory distress.     Breath sounds: Normal breath sounds. No wheezing.  Abdominal:     General: Abdomen is flat. Bowel sounds are normal.     Palpations: Abdomen is soft.   Musculoskeletal:        General: Normal range of motion.     Cervical back: Normal  range of motion.  Skin:    General: Skin is warm and dry.  Neurological:     General: No focal deficit present.     Mental Status: She is alert and oriented to person, place, and time. Mental status is at baseline.     Cranial Nerves: Cranial nerves 2-12 are intact.     Sensory: Sensation is intact.     Motor: Motor function is intact.     Coordination: Coordination is intact.     Gait: Gait is intact.  Psychiatric:        Mood and Affect: Mood normal.        Behavior: Behavior normal.        Thought Content: Thought content normal.        Judgment: Judgment normal.      UC Treatments / Results  Labs (all labs ordered are listed, but only abnormal results are displayed) Labs Reviewed - No data to display  EKG   Radiology No results found.  Procedures Procedures (including critical care time)  Medications Ordered in UC Medications - No data to display  Initial Impression / Assessment and Plan / UC Course  I have reviewed the triage vital signs and the nursing notes.  Pertinent labs & imaging results that were available during my care of the patient were reviewed by me and considered in my medical decision making (see chart for details).     Patient presents with symptoms likely from a viral upper respiratory infection. Differential includes bacterial pneumonia, sinusitis, allergic rhinitis, COVID-19, flu. Do not suspect underlying cardiopulmonary process. Symptoms seem unlikely related to ACS, CHF or COPD exacerbations, pneumonia, pneumothorax. Patient is nontoxic appearing and not in need of emergent medical intervention.  Recommended symptom control with over the counter medications.  Will treat left otitis media with Augmentin antibiotic.  Patient has mildly elevated blood pressure reading but no signs of hypertensive urgency.  Patient advised to monitor at home and follow-up with  PCP if remains elevated.  Patient was given strict return and ER precautions regarding this.  Return if symptoms fail to improve in 1-2 weeks or you develop shortness of breath, chest pain, severe headache. Patient states understanding and is agreeable.  Discharged with PCP followup.  Final Clinical Impressions(s) / UC Diagnoses   Final diagnoses:  Other non-recurrent acute nonsuppurative otitis media of left ear  Acute upper respiratory infection     Discharge Instructions      You have an ear infection which is being treated with an antibiotic.  This should also help your upper respiratory infection.  I am unable to prescribe prednisone given history of leg swelling in the past as well as stomach issues.  Although, this antibiotic should treat all of your symptoms.  If they do not get any better, please follow-up for further evaluation and management.  I would encourage you to monitor blood pressure at home with a home blood pressure cuff and follow-up with primary care doctor if it remains elevated.    ED Prescriptions     Medication Sig Dispense Auth. Provider   amoxicillin-clavulanate (AUGMENTIN) 875-125 MG tablet Take 1 tablet by mouth every 12 (twelve) hours. 14 tablet [place], Michele Rockers, [place]      PDMP not reviewed this encounter.   Teodora Medici, [place] 02/11/22 [place], [place], [place] 02/11/22 1538

## 2022-02-11 NOTE — Discharge Instructions (Signed)
You have an ear infection which is being treated with an antibiotic.  This should also help your upper respiratory infection.  I am unable to prescribe prednisone given history of leg swelling in the past as well as stomach issues.  Although, this antibiotic should treat all of your symptoms.  If they do not get any better, please follow-up for further evaluation and management.  I would encourage you to monitor blood pressure at home with a home blood pressure cuff and follow-up with primary care doctor if it remains elevated.

## 2022-03-02 DIAGNOSIS — Z79899 Other long term (current) drug therapy: Secondary | ICD-10-CM | POA: Diagnosis not present

## 2022-03-09 DIAGNOSIS — M25561 Pain in right knee: Secondary | ICD-10-CM | POA: Diagnosis not present

## 2022-03-09 DIAGNOSIS — M25562 Pain in left knee: Secondary | ICD-10-CM | POA: Diagnosis not present

## 2022-03-29 DIAGNOSIS — Z79899 Other long term (current) drug therapy: Secondary | ICD-10-CM | POA: Diagnosis not present

## 2022-04-20 DIAGNOSIS — R1084 Generalized abdominal pain: Secondary | ICD-10-CM | POA: Diagnosis not present

## 2022-04-27 DIAGNOSIS — Z79899 Other long term (current) drug therapy: Secondary | ICD-10-CM | POA: Diagnosis not present

## 2022-05-05 ENCOUNTER — Other Ambulatory Visit: Payer: Self-pay | Admitting: Physician Assistant

## 2022-05-05 DIAGNOSIS — R1084 Generalized abdominal pain: Secondary | ICD-10-CM | POA: Diagnosis not present

## 2022-05-05 DIAGNOSIS — M545 Low back pain, unspecified: Secondary | ICD-10-CM

## 2022-05-16 ENCOUNTER — Other Ambulatory Visit: Payer: Medicaid Other

## 2022-05-18 DIAGNOSIS — R1013 Epigastric pain: Secondary | ICD-10-CM | POA: Diagnosis not present

## 2022-05-25 DIAGNOSIS — Z79899 Other long term (current) drug therapy: Secondary | ICD-10-CM | POA: Diagnosis not present

## 2022-06-08 DIAGNOSIS — R1013 Epigastric pain: Secondary | ICD-10-CM | POA: Diagnosis not present

## 2022-06-08 DIAGNOSIS — Z1211 Encounter for screening for malignant neoplasm of colon: Secondary | ICD-10-CM | POA: Diagnosis not present

## 2022-06-19 DIAGNOSIS — A048 Other specified bacterial intestinal infections: Secondary | ICD-10-CM | POA: Diagnosis not present

## 2022-06-28 DIAGNOSIS — Z79899 Other long term (current) drug therapy: Secondary | ICD-10-CM | POA: Diagnosis not present

## 2022-07-07 DIAGNOSIS — R1084 Generalized abdominal pain: Secondary | ICD-10-CM | POA: Diagnosis not present

## 2022-07-07 DIAGNOSIS — K5909 Other constipation: Secondary | ICD-10-CM | POA: Diagnosis not present

## 2022-07-07 DIAGNOSIS — K219 Gastro-esophageal reflux disease without esophagitis: Secondary | ICD-10-CM | POA: Diagnosis not present

## 2022-07-27 DIAGNOSIS — M545 Low back pain, unspecified: Secondary | ICD-10-CM | POA: Diagnosis not present

## 2022-07-27 DIAGNOSIS — M25562 Pain in left knee: Secondary | ICD-10-CM | POA: Diagnosis not present

## 2022-07-27 DIAGNOSIS — Z6841 Body Mass Index (BMI) 40.0 and over, adult: Secondary | ICD-10-CM | POA: Diagnosis not present

## 2022-07-27 DIAGNOSIS — R030 Elevated blood-pressure reading, without diagnosis of hypertension: Secondary | ICD-10-CM | POA: Diagnosis not present

## 2022-07-27 DIAGNOSIS — G8929 Other chronic pain: Secondary | ICD-10-CM | POA: Diagnosis not present

## 2022-07-27 DIAGNOSIS — F17210 Nicotine dependence, cigarettes, uncomplicated: Secondary | ICD-10-CM | POA: Diagnosis not present

## 2022-07-27 DIAGNOSIS — M25561 Pain in right knee: Secondary | ICD-10-CM | POA: Diagnosis not present

## 2022-07-27 DIAGNOSIS — R7309 Other abnormal glucose: Secondary | ICD-10-CM | POA: Diagnosis not present

## 2022-07-27 DIAGNOSIS — Z79899 Other long term (current) drug therapy: Secondary | ICD-10-CM | POA: Diagnosis not present

## 2022-08-24 DIAGNOSIS — M545 Low back pain, unspecified: Secondary | ICD-10-CM | POA: Diagnosis not present

## 2022-08-24 DIAGNOSIS — Z6841 Body Mass Index (BMI) 40.0 and over, adult: Secondary | ICD-10-CM | POA: Diagnosis not present

## 2022-08-24 DIAGNOSIS — R7309 Other abnormal glucose: Secondary | ICD-10-CM | POA: Diagnosis not present

## 2022-08-24 DIAGNOSIS — Z79899 Other long term (current) drug therapy: Secondary | ICD-10-CM | POA: Diagnosis not present

## 2022-08-24 DIAGNOSIS — F17210 Nicotine dependence, cigarettes, uncomplicated: Secondary | ICD-10-CM | POA: Diagnosis not present

## 2022-08-24 DIAGNOSIS — R030 Elevated blood-pressure reading, without diagnosis of hypertension: Secondary | ICD-10-CM | POA: Diagnosis not present

## 2022-09-11 ENCOUNTER — Ambulatory Visit (HOSPITAL_COMMUNITY)
Admission: EM | Admit: 2022-09-11 | Discharge: 2022-09-11 | Disposition: A | Discharge status: Home / Self Care | Payer: Medicaid Other | Attending: Emergency Medicine | Admitting: Emergency Medicine

## 2022-09-11 ENCOUNTER — Ambulatory Visit (INDEPENDENT_AMBULATORY_CARE_PROVIDER_SITE_OTHER): Payer: Medicaid Other

## 2022-09-11 ENCOUNTER — Encounter (HOSPITAL_COMMUNITY): Payer: Self-pay

## 2022-09-11 ENCOUNTER — Ambulatory Visit (HOSPITAL_COMMUNITY): Payer: Medicaid Other

## 2022-09-11 DIAGNOSIS — M545 Low back pain, unspecified: Secondary | ICD-10-CM | POA: Diagnosis not present

## 2022-09-11 DIAGNOSIS — M5137 Other intervertebral disc degeneration, lumbosacral region: Secondary | ICD-10-CM | POA: Diagnosis not present

## 2022-09-11 DIAGNOSIS — W19XXXA Unspecified fall, initial encounter: Secondary | ICD-10-CM | POA: Diagnosis not present

## 2022-09-11 DIAGNOSIS — M51379 Other intervertebral disc degeneration, lumbosacral region without mention of lumbar back pain or lower extremity pain: Secondary | ICD-10-CM

## 2022-09-11 DIAGNOSIS — M546 Pain in thoracic spine: Secondary | ICD-10-CM | POA: Diagnosis not present
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
## 2022-09-11 NOTE — Discharge Instructions (Addendum)
Take home meds as directed. May use heat or ice to back for comfort. May use lidocaine patch or biofreeze for pain. Your xrays are negative for acute findings. You do have degenerative disc disease of lumbosacral spine. Please follow up with PCP, may need to referral to physical therapy for further back pain management. GO immediately to ER for loss of bowel and bladder,loss of function, saddle numbness, etc.

## 2022-09-27 DIAGNOSIS — R030 Elevated blood-pressure reading, without diagnosis of hypertension: Secondary | ICD-10-CM | POA: Diagnosis not present

## 2022-09-27 DIAGNOSIS — Z6841 Body Mass Index (BMI) 40.0 and over, adult: Secondary | ICD-10-CM | POA: Diagnosis not present

## 2022-09-27 DIAGNOSIS — Z1159 Encounter for screening for other viral diseases: Secondary | ICD-10-CM | POA: Diagnosis not present

## 2022-09-27 DIAGNOSIS — M545 Low back pain, unspecified: Secondary | ICD-10-CM | POA: Diagnosis not present

## 2022-09-27 DIAGNOSIS — E559 Vitamin D deficiency, unspecified: Secondary | ICD-10-CM | POA: Diagnosis not present

## 2022-09-27 DIAGNOSIS — Z79899 Other long term (current) drug therapy: Secondary | ICD-10-CM | POA: Diagnosis not present

## 2022-09-27 DIAGNOSIS — Z1231 Encounter for screening mammogram for malignant neoplasm of breast: Secondary | ICD-10-CM | POA: Diagnosis not present

## 2022-09-27 DIAGNOSIS — Z1211 Encounter for screening for malignant neoplasm of colon: Secondary | ICD-10-CM | POA: Diagnosis not present

## 2022-09-27 DIAGNOSIS — R5383 Other fatigue: Secondary | ICD-10-CM | POA: Diagnosis not present

## 2022-09-27 DIAGNOSIS — Z780 Asymptomatic menopausal state: Secondary | ICD-10-CM | POA: Diagnosis not present

## 2022-09-27 DIAGNOSIS — F17210 Nicotine dependence, cigarettes, uncomplicated: Secondary | ICD-10-CM | POA: Diagnosis not present

## 2022-09-29 ENCOUNTER — Other Ambulatory Visit: Payer: Self-pay | Admitting: Physician Assistant

## 2022-09-29 DIAGNOSIS — Z1231 Encounter for screening mammogram for malignant neoplasm of breast: Secondary | ICD-10-CM

## 2022-10-25 DIAGNOSIS — Z79899 Other long term (current) drug therapy: Secondary | ICD-10-CM | POA: Diagnosis not present

## 2022-10-25 DIAGNOSIS — G8929 Other chronic pain: Secondary | ICD-10-CM | POA: Diagnosis not present

## 2022-10-25 DIAGNOSIS — Z6841 Body Mass Index (BMI) 40.0 and over, adult: Secondary | ICD-10-CM | POA: Diagnosis not present

## 2022-10-25 DIAGNOSIS — F17210 Nicotine dependence, cigarettes, uncomplicated: Secondary | ICD-10-CM | POA: Diagnosis not present

## 2022-10-25 DIAGNOSIS — M25561 Pain in right knee: Secondary | ICD-10-CM | POA: Diagnosis not present

## 2022-10-25 DIAGNOSIS — Z1231 Encounter for screening mammogram for malignant neoplasm of breast: Secondary | ICD-10-CM | POA: Diagnosis not present

## 2022-10-25 DIAGNOSIS — M545 Low back pain, unspecified: Secondary | ICD-10-CM | POA: Diagnosis not present

## 2022-10-25 DIAGNOSIS — M25562 Pain in left knee: Secondary | ICD-10-CM | POA: Diagnosis not present

## 2022-10-26 DIAGNOSIS — M25561 Pain in right knee: Secondary | ICD-10-CM | POA: Diagnosis not present

## 2022-10-26 DIAGNOSIS — M25562 Pain in left knee: Secondary | ICD-10-CM | POA: Diagnosis not present

## 2022-11-19 ENCOUNTER — Ambulatory Visit: Payer: Medicaid Other

## 2022-11-23 DIAGNOSIS — R030 Elevated blood-pressure reading, without diagnosis of hypertension: Secondary | ICD-10-CM | POA: Diagnosis not present

## 2022-11-23 DIAGNOSIS — M25561 Pain in right knee: Secondary | ICD-10-CM | POA: Diagnosis not present

## 2022-11-23 DIAGNOSIS — M25461 Effusion, right knee: Secondary | ICD-10-CM | POA: Diagnosis not present

## 2022-11-23 DIAGNOSIS — F17210 Nicotine dependence, cigarettes, uncomplicated: Secondary | ICD-10-CM | POA: Diagnosis not present

## 2022-11-23 DIAGNOSIS — G8929 Other chronic pain: Secondary | ICD-10-CM | POA: Diagnosis not present

## 2022-11-23 DIAGNOSIS — Z79899 Other long term (current) drug therapy: Secondary | ICD-10-CM | POA: Diagnosis not present

## 2022-11-23 DIAGNOSIS — Z6841 Body Mass Index (BMI) 40.0 and over, adult: Secondary | ICD-10-CM | POA: Diagnosis not present

## 2022-11-23 DIAGNOSIS — M25562 Pain in left knee: Secondary | ICD-10-CM | POA: Diagnosis not present

## 2022-11-23 DIAGNOSIS — M545 Low back pain, unspecified: Secondary | ICD-10-CM | POA: Diagnosis not present

## 2022-12-01 ENCOUNTER — Ambulatory Visit: Payer: Medicaid Other | Admitting: Orthopaedic Surgery

## 2022-12-07 ENCOUNTER — Other Ambulatory Visit (INDEPENDENT_AMBULATORY_CARE_PROVIDER_SITE_OTHER): Payer: Medicaid Other

## 2022-12-07 ENCOUNTER — Ambulatory Visit (INDEPENDENT_AMBULATORY_CARE_PROVIDER_SITE_OTHER): Payer: Medicaid Other | Admitting: Orthopaedic Surgery

## 2022-12-07 DIAGNOSIS — M25561 Pain in right knee: Secondary | ICD-10-CM | POA: Diagnosis not present

## 2022-12-07 DIAGNOSIS — Z6841 Body Mass Index (BMI) 40.0 and over, adult: Secondary | ICD-10-CM | POA: Diagnosis not present

## 2022-12-07 MED ORDER — BUPIVACAINE HCL 0.5 % IJ SOLN
2.0000 mL | INTRAMUSCULAR | Status: AC | PRN
  Administered 2022-12-07: 2 mL via INTRA_ARTICULAR

## 2022-12-07 MED ORDER — LIDOCAINE HCL 1 % IJ SOLN
2.0000 mL | INTRAMUSCULAR | Status: AC | PRN
  Administered 2022-12-07: 2 mL

## 2022-12-07 MED ORDER — METHYLPREDNISOLONE ACETATE 40 MG/ML IJ SUSP
40.0000 mg | INTRAMUSCULAR | Status: AC | PRN
  Administered 2022-12-07: 40 mg via INTRA_ARTICULAR

## 2022-12-07 MED ORDER — DICLOFENAC SODIUM 75 MG PO TBEC: 75.0000 mg | DELAYED_RELEASE_TABLET | Freq: Two times a day (BID) | ORAL | 2 refills | Status: AC

## 2022-12-07 NOTE — Progress Notes (Signed)
Office Visit Note   Patient: Shari Smith           Date of Birth: 11/09/1970           MRN: [id number] Visit Date: 12/07/2022              Requested by: Paschal Dopp, PA [address] [place],  [place] [address] PCP: Paschal Dopp, PA   Assessment & Plan: Visit Diagnoses:  1. Acute pain of right knee   2. Body mass index 45.0-49.9, adult     Plan: Impression is right knee degenerative joint disease and morbid obesity.  Disease process explained in detail and treatment options were discussed to include steroid injection, NSAIDs, weight loss, strengthening.  She tolerated the steroid injection well today.  Will see her back as needed.  Follow-Up Instructions: No follow-ups on file.   Orders:  Orders Placed This Encounter  Procedures   XR KNEE 3 VIEW RIGHT   No orders of the defined types were placed in this encounter.     Procedures: Large Joint Inj: R knee on 12/07/2022 9:56 AM Indications: pain Details: 22 G needle  Arthrogram: No  Medications: 40 mg methylPREDNISolone acetate 40 MG/ML; 2 mL lidocaine 1 %; 2 mL bupivacaine 0.5 % Consent was given by the patient. Patient was prepped and draped in the usual sterile fashion.       Clinical Data: No additional findings.   Subjective: Chief Complaint  Patient presents with   Right Knee - Pain    HPI  Shari Smith is a 52 year old female here for evaluation of chronic right knee pain for about 6 months.  Denies any injuries.  Has swelling at times.  Endorses popping and giving way.  Has pain globally throughout the knee.  Using stairs causes pain to be worse.  Review of Systems  Constitutional: Negative.   HENT: Negative.    Eyes: Negative.   Respiratory: Negative.    Cardiovascular: Negative.   Endocrine: Negative.   Musculoskeletal: Negative.   Neurological: Negative.   Hematological: Negative.   Psychiatric/Behavioral: Negative.    All other systems reviewed and are  negative.    Objective: Vital Signs: LMP 07/08/2020   Physical Exam Vitals and nursing note reviewed.  Constitutional:      Appearance: She is well-developed.  HENT:     Head: Atraumatic.     Nose: Nose normal.  Eyes:     Extraocular Movements: Extraocular movements intact.  Cardiovascular:     Pulses: Normal pulses.  Pulmonary:     Effort: Pulmonary effort is normal.  Abdominal:     Palpations: Abdomen is soft.  Musculoskeletal:     Cervical back: Neck supple.  Skin:    General: Skin is warm.     Capillary Refill: Capillary refill takes less than 2 seconds.  Neurological:     Mental Status: She is alert. Mental status is at baseline.  Psychiatric:        Behavior: Behavior normal.        Thought Content: Thought content normal.        Judgment: Judgment normal.     Ortho Exam  Examination right knee shows mild medial joint line tenderness.  Collaterals and cruciates are stable.  No joint effusion.  1+ patellofemoral crepitus.  Specialty Comments:  No specialty comments available.  Imaging: No results found.   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   Anemia    Anxiety    Arthritis    Fibroids    Focal nodular hyperplasia of liver    GERD (gastroesophageal reflux disease)    Headache    migraines   Hypertension    Thyroid disease     Family History  Problem Relation Age of Onset   Hypertension Mother    Cancer Mother        stomach - deceased with disease   Hypertension Father    Breast cancer Sister 56   Seizures Brother     Hypertension Brother    Diabetes Brother    Hypertension Brother    Diabetes Sister     Past Surgical History:  Procedure Laterality Date   DILATION AND CURETTAGE OF UTERUS     HERNIA REPAIR     ventral hernia repair   HYSTEROSCOPY WITH D & C N/A 06/22/2018   Procedure: DILATATION AND CURETTAGE /HYSTEROSCOPY PAP SMEAR OF CERIX ENDO CERVICAL CURETTAGE;  Surgeon: Shonna Chock, MD;  Location: [facility];  Service: Gynecology;  Laterality: N/A;   LAPAROSCOPIC CHOLECYSTECTOMY  2010   TUBAL LIGATION     VAGINAL HYSTERECTOMY Bilateral 07/22/2020   Procedure: TOTAL VAGINAL HYSTERECTOMY WITH BILATERAL SALPINGECTOMIES;  Surgeon: Hermina Staggers, MD;  Location: [facility];  Service: Gynecology;  Laterality: Bilateral;   VENTRAL HERNIA REPAIR  2017   with mesh per patient   Social History   Occupational History   Not on file  Tobacco Use   Smoking status: Every Day    Packs/day: 0.25    Years: 10.00    Additional pack years: 0.00    Total pack years: 2.50    Types: Cigarettes   Smokeless tobacco: Never   Tobacco comments:    Patient states she smokes 4 cigarettes a day  Vaping Use   Vaping Use: Never used  Substance and Sexual Activity   Alcohol use: Not Currently    Comment: few times a year   Drug use: No   Sexual activity: Not Currently    Birth control/protection: Surgical

## 2022-12-20 NOTE — Therapy (Addendum)
OUTPATIENT PHYSICAL THERAPY LOWER EXTREMITY EVALUATION / DISCHARGE  PHYSICAL THERAPY DISCHARGE SUMMARY  Visits from Start of Care: 1  Current functional level related to goals / functional outcomes: Current status unknown   Remaining deficits: Current deficits unknown due to pt not returning following evaluationg.   Education / Equipment: Initial HEP   Patient agrees to discharge. Patient goals were not met. Patient is being discharged due to not returning since the last visit.  Lulu Riding PT, DPT, LAT, ATC  01/24/23  9:58 AM          Patient Name: Shari Smith MRN: [id number] DOB:06-Jul-1971, 52 y.o., female Today's Date: 12/21/2022  END OF SESSION:  PT End of Session - 12/21/22 1339     Visit Number 1    Number of Visits 13    Date for PT Re-Evaluation 03/01/23    Authorization Type [employer]    PT Start Time 1338   pt arrive late   PT Stop Time 1418    PT Time Calculation (min) 40 min    Activity Tolerance Patient tolerated treatment well    Behavior During Therapy [facility] for tasks assessed/performed             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   Anemia    Anxiety    Arthritis    Fibroids    Focal nodular hyperplasia of liver    GERD (gastroesophageal reflux disease)    Headache    migraines   Hypertension    Thyroid disease    Past Surgical History:  Procedure Laterality Date   DILATION AND CURETTAGE OF UTERUS     HERNIA REPAIR     ventral hernia repair   HYSTEROSCOPY WITH D & C N/A 06/22/2018   Procedure: DILATATION AND CURETTAGE /HYSTEROSCOPY PAP SMEAR OF CERIX ENDO CERVICAL CURETTAGE;  Surgeon: Shonna Chock, MD;  Location: [facility];  Service: Gynecology;  Laterality: N/A;   LAPAROSCOPIC CHOLECYSTECTOMY  2010   TUBAL LIGATION     VAGINAL HYSTERECTOMY Bilateral 07/22/2020   Procedure: TOTAL VAGINAL HYSTERECTOMY WITH BILATERAL SALPINGECTOMIES;  Surgeon: Hermina Staggers, MD;  Location: [facility] OR;  Service: Gynecology;  Laterality: Bilateral;   VENTRAL HERNIA REPAIR  2017   with mesh per patient   Patient Active Problem List   Diagnosis Date Noted   Obesity, morbid (HCC) 09/11/2022   History of vaginal hysterectomy 09/04/2020   History of bilateral salpingectomy 09/04/2020   Anemia 09/04/2020   Post-operative state 07/22/2020   Acute pain of left knee 03/30/2018   Adenoma of left adrenal gland 03/30/2018   Focal nodular hyperplasia of liver 10/25/2014    PCP: Paschal Dopp, PA  REFERRING PROVIDER: Tarry Kos, MD   REFERRING DIAG: Acute pain of right knee [M25.561], Body mass index 45.0-49.9, adult (HCC) [Z68.42]   THERAPY DIAG:  Chronic pain of left knee  Chronic pain of right knee  Muscle weakness (generalized)  Rationale for Evaluation and Treatment: Rehabilitation  ONSET DATE: 1 year  SUBJECTIVE:   SUBJECTIVE STATEMENT: Pt reports the R knee has been bothering her for over the last year. She reports per the MD recommended weight lost.  Started about 1 year ago with no specific cause.  She reports the pain is getting worse since onset, and reports the L knee is starting to act up. She reports the medication helps some but not much. She reports getting a knee brace  which helps slightly.  She reports occasionally buckling and popping in the knee   PERTINENT HISTORY: Hx of anxiety, anemia, obesity PAIN:  Are you having pain? Yes: NPRS scale: 7/10, a worst 10/10 Pain location: all around the knee Pain description: agrivating  Aggravating factors: standing on it  Relieving factors: topical ointment, bengay  PRECAUTIONS: None  WEIGHT BEARING RESTRICTIONS: No  FALLS:  Has patient fallen in last 6 months? No  LIVING ENVIRONMENT: Lives with: lives with their family Lives in: House/apartment Stairs: Yes: Internal: 16 steps; on left going up Has following equipment at home: None  OCCUPATION: unemployed  PLOF:  Independent with basic ADLs  PATIENT GOALS: get the knee right, and return to work.   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   PATIENT SURVEYS:  FOTO 26% and predicted 45%   COGNITION: Overall cognitive status: Within functional limits for tasks assessed     SENSATION: WFL   POSTURE: rounded shoulders and forward head  PALPATION: TTP along bil mid to distal quad and distal hamstrings bil with R >L   LOWER EXTREMITY ROM:  Active ROM Right eval Left eval  Hip flexion    Hip extension    Hip abduction    Hip adduction    Hip internal rotation    Hip external rotation    Knee flexion 109 104  Knee extension 4 5  Ankle dorsiflexion    Ankle plantarflexion    Ankle inversion    Ankle eversion     (Blank rows = not tested)  LOWER EXTREMITY MMT:  MMT Right eval Left eval  Hip flexion 4 4  Hip extension 4- 4-  Hip abduction 3+ 3+  Hip adduction 3+ 3+  Hip internal rotation    Hip external rotation    Knee flexion 3+ 3+  Knee extension 3+ 3+  Ankle dorsiflexion    Ankle plantarflexion    Ankle inversion    Ankle eversion     (Blank rows = not tested)  LOWER EXTREMITY SPECIAL TESTS:  Not performed  FUNCTIONAL TESTS:    GAIT: Distance walked: 110 ft to treatment room Assistive device utilized: None Level of assistance: Complete Independence Comments: antalgic pattern with circumducted gait   TODAY'S TREATMENT:                                                                                                                              [facility] Adult PT Treatment:                                                DATE: 12/21/2022 Therapeutic Exercise: Bil quad set 1 x 10 holding 10 sec  Glute set 1 x 10 holding 10 sec Clamshell 1 x 10 with  GTB Seated hamstring stretch 2 x 30 sec   PATIENT EDUCATION:  Education details: evaluation findings, POC, goals, HEP with proper  form. Person educated: Patient Education method: Explanation, Verbal cues, and Handouts Education comprehension: verbalized understanding  HOME EXERCISE PROGRAM: Access Code: [email or web address] URL: [email or web address] Date: 12/21/2022 Prepared by: Lulu Riding  Exercises - Seated Heel Slide  - 1 x daily - 7 x weekly - 2 sets - 10 reps - Supine Quad Set  - 1 x daily - 7 x weekly - 2 sets - 10 reps - 10 seconds hold - Supine Gluteal Sets  - 1 x daily - 7 x weekly - 2 sets - 10 reps - Hooklying Clamshell with Resistance  - 1 x daily - 7 x weekly - 2 sets - 10 reps - Seated Hamstring Stretch  - 1 x daily - 7 x weekly - 2 sets - 2 reps - 30 hold  ASSESSMENT:  CLINICAL IMPRESSION: Patient is a 52 y.o. F who was seen today for physical therapy evaluation and treatment for diagnosis of right knee pain. She demonstrates limited knee flexion secondary to pain and soft tissue approximation bil. TTP along the mid to distal quad and distal hamstrings bil with R>L. Weakness noted bil with MMT, and she demonstrates an antalgic gait pattern with limited stride bil.  She would benefit from physical therapy to increase strength, improve knee ROM, promote gait efficiency and maximize her function by addressing the deficits listed.   OBJECTIVE IMPAIRMENTS: Abnormal gait, decreased activity tolerance, decreased endurance, decreased ROM, increased fascial restrictions, postural dysfunction, obesity, and pain.   ACTIVITY LIMITATIONS: lifting, standing, squatting, and locomotion level  PARTICIPATION LIMITATIONS: cleaning, driving, shopping, community activity, and occupation  PERSONAL FACTORS: Age, Time since onset of injury/illness/exacerbation, and 1-2 comorbidities: Anxiety, Obesity  are also affecting patient's functional outcome.   REHAB POTENTIAL: Good  CLINICAL DECISION MAKING: Evolving/moderate complexity  EVALUATION COMPLEXITY: Moderate   GOALS: Goals reviewed with patient?  Yes  SHORT TERM GOALS: Target date: 01/18/2023   Pt to be IND with initial HEP for therapeutic progression Baseline: no previous HEP Goal status: INITIAL  2.  Pt to be able to walk / stand for >/= 15 min with </= 6/10 pain at max to promote functional  Baseline: walking for any period of time increases pain Goal status: INITIAL  3.  Pt to verbalize/ demo efficient posture and gait pattern to maximize efficiency to reduce pain/ stress on the knees Baseline: no knowldedge of efficient posture Goal status: INITIAL  LONG TERM GOALS: Target date: 03/01/2023   Increase bil knee flexion by >/= 8 degrees to promote functional mobility with </= 3/10 max pain Baseline: see flowsheet Goal status: INITIAL  2.  Pt to be able to walk/ stand for >/= 45 min with </= 2/10 max pain to demo improvement in endurance for potential work related requirements Baseline: pain with any mobility  Goal status: INITIAL  3.  Increase FOTO score to >/= 45% to demo improvement in function Baseline: initial score 26% Goal status: INITIAL  4.  Pt to be able to perform house ADLS with </= minimal difficulty to demonsrate improvement in function Baseline: mod difficulty Goal status: INITIAL  5.  Increase gross bil LE strength to >/= 4/5 to promote knee stability and maximize functional endurance and stability  Baseline: see flow sheet Goal status: INITIAL  6.  Pt to be IND with all HEP and to be able to maintain and progress current LOF IND. Baseline:  Goal status: INITIAL   PLAN:  PT FREQUENCY: 1-2x/week  PT DURATION: 10 weeks  PLANNED INTERVENTIONS: Therapeutic exercises, Therapeutic activity, Neuromuscular re-education, Balance training, Gait training, Patient/Family education, Self Care, Joint mobilization, Stair training, Aquatic Therapy, Dry Needling, Cryotherapy, Moist heat, Manual therapy, and Re-evaluation  PLAN FOR NEXT SESSION: Review/ update HEP PRN. Gross LE strenthening, gait training,  endurance training.   Alaney Witter PT, DPT, LAT, ATC  12/21/22  3:57 PM      Check all possible CPT codes: 16109 - PT Re-evaluation, 97110- Therapeutic Exercise, [phone]- Neuro Re-education, [phone] - Manual Therapy, 97530 - Therapeutic Activities, [phone] - Self Care, [phone] - Physical performance training, and [phone] - Aquatic therapy    Check all conditions that are expected to impact treatment: {Conditions expected to impact treatment:Morbid obesity and Musculoskeletal disorders   If treatment provided at initial evaluation, no treatment charged due to lack of authorization.

## 2022-12-21 ENCOUNTER — Other Ambulatory Visit: Payer: Self-pay

## 2022-12-21 ENCOUNTER — Encounter: Payer: Self-pay | Admitting: Physical Therapy

## 2022-12-21 ENCOUNTER — Ambulatory Visit: Payer: Medicaid Other | Attending: Orthopaedic Surgery | Admitting: Physical Therapy

## 2022-12-21 DIAGNOSIS — M25561 Pain in right knee: Secondary | ICD-10-CM | POA: Diagnosis not present

## 2022-12-21 DIAGNOSIS — M25562 Pain in left knee: Secondary | ICD-10-CM | POA: Insufficient documentation

## 2022-12-21 DIAGNOSIS — M6281 Muscle weakness (generalized): Secondary | ICD-10-CM | POA: Diagnosis not present

## 2022-12-21 DIAGNOSIS — Z6841 Body Mass Index (BMI) 40.0 and over, adult: Secondary | ICD-10-CM | POA: Diagnosis not present

## 2022-12-21 DIAGNOSIS — G8929 Other chronic pain: Secondary | ICD-10-CM

## 2022-12-21 NOTE — Patient Instructions (Signed)
Aquatic Therapy at [facility]-  What to Expect!  Where:   [facility] [address] [place], [place] [address] Rehab phone [phone]  NOTE:  You will receive an automated phone message reminding you of your appt and it will say the appointment is at the [address] [facility] [address] [facility] clinic.          How to Prepare: Please make sure you drink 8 ounces of water about one hour prior to your pool session A caregiver may attend if needed with the patient to help assist as needed. A caregiver can sit in the pool room on chair. Please arrive IN YOUR SUIT and 15 minutes prior to your appointment - this helps to avoid delays in starting your session. Please make sure to attend to any toileting needs prior to entering the pool Locker rooms for changing are provided.   There is direct access to the pool deck form the locker room.  You can lock your belongings in a locker with lock provided. Once on the pool deck your therapist will ask if you have signed the Patient  Consent and Assignment of Benefits form before beginning treatment Your therapist may take your blood pressure prior to, during and after your session if indicated We usually try and create a home exercise program based on activities we do in the pool.  Please be thinking about who might be able to assist you in the pool should you need to participate in an aquatic home exercise program at the time of discharge if you need assistance.  Some patients do not want to or do not have the ability to participate in an aquatic home program - this is not a barrier in any way to you participating in aquatic therapy as part of your current therapy plan! After Discharge from PT, you can continue using home program at  the [facility]/, there is a drop-in fee for $5 ($45 a month)or for 60 years  or older $4.00 ($40 a month for seniors ) or any local [employer] pool.  Memberships for purchase are  available for gym/pool at [facility]  IT IS VERY IMPORTANT THAT YOUR LAST VISIT BE IN THE CLINIC AT [address] AFTER YOUR LAST AQUATIC VISIT.  PLEASE MAKE SURE THAT YOU HAVE A [address]  APPOINTMENT SCHEDULED.   About the pool: Pool is located approximately 500 FT from the entrance of the building.  Please bring a support person if you need assistance traveling this      distance.   Your therapist will assist you in entering the water; there are two ways to           enter: stairs with railings, and a mechanical lift. Your therapist will determine the most appropriate way for you.  Water temperature is usually between 88-90 degrees  There may be up to 2 other swimmers in the pool at the same time  The pool deck is tile, please wear shoes with good traction if you prefer not to be barefoot.    Contact Info:  For appointment scheduling and cancellations:         Please call the [facility]  PH:[phone]              Aquatic Therapy  Outpatient [facility]       All sessions are 45 minutes                                                    

## 2022-12-25 DIAGNOSIS — F17210 Nicotine dependence, cigarettes, uncomplicated: Secondary | ICD-10-CM | POA: Diagnosis not present

## 2022-12-25 DIAGNOSIS — M25561 Pain in right knee: Secondary | ICD-10-CM | POA: Diagnosis not present

## 2022-12-25 DIAGNOSIS — R030 Elevated blood-pressure reading, without diagnosis of hypertension: Secondary | ICD-10-CM | POA: Diagnosis not present

## 2022-12-25 DIAGNOSIS — Z6841 Body Mass Index (BMI) 40.0 and over, adult: Secondary | ICD-10-CM | POA: Diagnosis not present

## 2022-12-25 DIAGNOSIS — M25461 Effusion, right knee: Secondary | ICD-10-CM | POA: Diagnosis not present

## 2022-12-25 DIAGNOSIS — M545 Low back pain, unspecified: Secondary | ICD-10-CM | POA: Diagnosis not present

## 2022-12-25 DIAGNOSIS — Z79899 Other long term (current) drug therapy: Secondary | ICD-10-CM | POA: Diagnosis not present

## 2022-12-25 DIAGNOSIS — M25562 Pain in left knee: Secondary | ICD-10-CM | POA: Diagnosis not present

## 2023-01-11 ENCOUNTER — Ambulatory Visit: Payer: Medicaid Other | Attending: Orthopaedic Surgery | Admitting: Physical Therapy

## 2023-01-11 ENCOUNTER — Telehealth: Payer: Self-pay | Admitting: Physical Therapy

## 2023-01-11 NOTE — Telephone Encounter (Signed)
Called and LVM regarding missed appt today. I referenced her next scheduled appointment day/ time and if she cannot make it to please call and cancel, if possible 24 hours in advance.  I reviewed the attendance policy and noted where it can be found in the packet that was provided at her evaluation.  Demitri Kucinski PT, DPT, LAT, ATC  01/11/23  2:08 PM

## 2023-01-13 ENCOUNTER — Ambulatory Visit: Payer: Medicaid Other | Admitting: Physical Therapy

## 2023-01-19 ENCOUNTER — Telehealth: Payer: Self-pay | Admitting: Physical Therapy

## 2023-01-19 ENCOUNTER — Ambulatory Visit: Payer: Medicaid Other | Admitting: Physical Therapy

## 2023-01-19 NOTE — Telephone Encounter (Signed)
Spoke with Mrs Stanaway regarding missed appointment today. She stated she was planning to come in today but didn't have the $4 copay. She stated she plans to come to her appointment tomorrow. I discussed with her since this is her 2nd missed appointment, per our policy she is able to only schedule 1 appointment at a time, so when she comes in tomorrow she can schedule for her next appointment. If she cannot make an appointment to pleae call ahead and cancel / reschedule the appointment due to 3 missed appointments is grounds for discharge.  She verbalized understanding.

## 2023-01-20 ENCOUNTER — Ambulatory Visit: Payer: Medicaid Other | Admitting: Physical Therapy

## 2023-01-23 DIAGNOSIS — R030 Elevated blood-pressure reading, without diagnosis of hypertension: Secondary | ICD-10-CM | POA: Diagnosis not present

## 2023-01-23 DIAGNOSIS — Z79899 Other long term (current) drug therapy: Secondary | ICD-10-CM | POA: Diagnosis not present

## 2023-01-23 DIAGNOSIS — M25561 Pain in right knee: Secondary | ICD-10-CM | POA: Diagnosis not present

## 2023-01-23 DIAGNOSIS — M545 Low back pain, unspecified: Secondary | ICD-10-CM | POA: Diagnosis not present

## 2023-01-23 DIAGNOSIS — Z6841 Body Mass Index (BMI) 40.0 and over, adult: Secondary | ICD-10-CM | POA: Diagnosis not present

## 2023-01-23 DIAGNOSIS — M25562 Pain in left knee: Secondary | ICD-10-CM | POA: Diagnosis not present

## 2023-01-23 DIAGNOSIS — F17210 Nicotine dependence, cigarettes, uncomplicated: Secondary | ICD-10-CM | POA: Diagnosis not present

## 2023-01-25 ENCOUNTER — Ambulatory Visit: Payer: Medicaid Other | Admitting: Physical Therapy

## 2023-01-27 ENCOUNTER — Ambulatory Visit: Payer: Medicaid Other | Admitting: Physical Therapy

## 2023-02-01 ENCOUNTER — Ambulatory Visit: Payer: Medicaid Other | Admitting: Physical Therapy

## 2023-02-03 ENCOUNTER — Encounter: Payer: Medicaid Other | Admitting: Physical Therapy

## 2023-02-21 DIAGNOSIS — M25562 Pain in left knee: Secondary | ICD-10-CM | POA: Diagnosis not present

## 2023-02-21 DIAGNOSIS — Z79899 Other long term (current) drug therapy: Secondary | ICD-10-CM | POA: Diagnosis not present

## 2023-02-21 DIAGNOSIS — Z6841 Body Mass Index (BMI) 40.0 and over, adult: Secondary | ICD-10-CM | POA: Diagnosis not present

## 2023-02-21 DIAGNOSIS — R030 Elevated blood-pressure reading, without diagnosis of hypertension: Secondary | ICD-10-CM | POA: Diagnosis not present

## 2023-02-21 DIAGNOSIS — M25561 Pain in right knee: Secondary | ICD-10-CM | POA: Diagnosis not present

## 2023-02-21 DIAGNOSIS — M545 Low back pain, unspecified: Secondary | ICD-10-CM | POA: Diagnosis not present

## 2023-02-21 DIAGNOSIS — F17210 Nicotine dependence, cigarettes, uncomplicated: Secondary | ICD-10-CM | POA: Diagnosis not present

## 2023-03-02 ENCOUNTER — Encounter (INDEPENDENT_AMBULATORY_CARE_PROVIDER_SITE_OTHER): Payer: Medicaid Other | Admitting: Internal Medicine

## 2023-03-24 DIAGNOSIS — F419 Anxiety disorder, unspecified: Secondary | ICD-10-CM | POA: Diagnosis not present

## 2023-03-24 DIAGNOSIS — F32A Depression, unspecified: Secondary | ICD-10-CM | POA: Diagnosis not present

## 2023-03-24 DIAGNOSIS — M545 Low back pain, unspecified: Secondary | ICD-10-CM | POA: Diagnosis not present

## 2023-03-24 DIAGNOSIS — R030 Elevated blood-pressure reading, without diagnosis of hypertension: Secondary | ICD-10-CM | POA: Diagnosis not present

## 2023-03-24 DIAGNOSIS — R7303 Prediabetes: Secondary | ICD-10-CM | POA: Diagnosis not present

## 2023-03-24 DIAGNOSIS — Z6841 Body Mass Index (BMI) 40.0 and over, adult: Secondary | ICD-10-CM | POA: Diagnosis not present

## 2023-03-24 DIAGNOSIS — Z0130 Encounter for examination of blood pressure without abnormal findings: Secondary | ICD-10-CM | POA: Diagnosis not present

## 2023-03-24 DIAGNOSIS — M25562 Pain in left knee: Secondary | ICD-10-CM | POA: Diagnosis not present

## 2023-03-24 DIAGNOSIS — Z1231 Encounter for screening mammogram for malignant neoplasm of breast: Secondary | ICD-10-CM | POA: Diagnosis not present

## 2023-03-24 DIAGNOSIS — F17210 Nicotine dependence, cigarettes, uncomplicated: Secondary | ICD-10-CM | POA: Diagnosis not present

## 2023-03-24 DIAGNOSIS — M25561 Pain in right knee: Secondary | ICD-10-CM | POA: Diagnosis not present

## 2023-03-25 DIAGNOSIS — Z79899 Other long term (current) drug therapy: Secondary | ICD-10-CM | POA: Diagnosis not present

## 2023-04-06 ENCOUNTER — Encounter (INDEPENDENT_AMBULATORY_CARE_PROVIDER_SITE_OTHER): Payer: Medicaid Other | Admitting: Family Medicine

## 2023-04-22 DIAGNOSIS — F419 Anxiety disorder, unspecified: Secondary | ICD-10-CM | POA: Diagnosis not present

## 2023-04-22 DIAGNOSIS — M25561 Pain in right knee: Secondary | ICD-10-CM | POA: Diagnosis not present

## 2023-04-22 DIAGNOSIS — F32A Depression, unspecified: Secondary | ICD-10-CM | POA: Diagnosis not present

## 2023-04-22 DIAGNOSIS — F17210 Nicotine dependence, cigarettes, uncomplicated: Secondary | ICD-10-CM | POA: Diagnosis not present

## 2023-04-22 DIAGNOSIS — R7303 Prediabetes: Secondary | ICD-10-CM | POA: Diagnosis not present

## 2023-04-22 DIAGNOSIS — M25562 Pain in left knee: Secondary | ICD-10-CM | POA: Diagnosis not present

## 2023-04-22 DIAGNOSIS — Z0130 Encounter for examination of blood pressure without abnormal findings: Secondary | ICD-10-CM | POA: Diagnosis not present

## 2023-04-22 DIAGNOSIS — Z6841 Body Mass Index (BMI) 40.0 and over, adult: Secondary | ICD-10-CM | POA: Diagnosis not present

## 2023-04-22 DIAGNOSIS — R030 Elevated blood-pressure reading, without diagnosis of hypertension: Secondary | ICD-10-CM | POA: Diagnosis not present

## 2023-04-22 DIAGNOSIS — M545 Low back pain, unspecified: Secondary | ICD-10-CM | POA: Diagnosis not present

## 2023-05-20 DIAGNOSIS — Z79899 Other long term (current) drug therapy: Secondary | ICD-10-CM | POA: Diagnosis not present

## 2023-05-20 DIAGNOSIS — M545 Low back pain, unspecified: Secondary | ICD-10-CM | POA: Diagnosis not present

## 2023-05-20 DIAGNOSIS — R109 Unspecified abdominal pain: Secondary | ICD-10-CM | POA: Diagnosis not present

## 2023-05-20 DIAGNOSIS — Z6841 Body Mass Index (BMI) 40.0 and over, adult: Secondary | ICD-10-CM | POA: Diagnosis not present

## 2023-05-20 DIAGNOSIS — M2550 Pain in unspecified joint: Secondary | ICD-10-CM | POA: Diagnosis not present

## 2023-06-17 DIAGNOSIS — Z79899 Other long term (current) drug therapy: Secondary | ICD-10-CM | POA: Diagnosis not present

## 2023-06-17 DIAGNOSIS — M545 Low back pain, unspecified: Secondary | ICD-10-CM | POA: Diagnosis not present

## 2023-06-17 DIAGNOSIS — Z6841 Body Mass Index (BMI) 40.0 and over, adult: Secondary | ICD-10-CM | POA: Diagnosis not present

## 2023-06-17 DIAGNOSIS — M2550 Pain in unspecified joint: Secondary | ICD-10-CM | POA: Diagnosis not present

## 2023-06-17 DIAGNOSIS — R109 Unspecified abdominal pain: Secondary | ICD-10-CM | POA: Diagnosis not present

## 2023-06-21 ENCOUNTER — Ambulatory Visit: Payer: Medicaid Other | Attending: Physical Therapy | Admitting: Physical Therapy

## 2023-07-15 DIAGNOSIS — M545 Low back pain, unspecified: Secondary | ICD-10-CM | POA: Diagnosis not present

## 2023-07-15 DIAGNOSIS — Z6841 Body Mass Index (BMI) 40.0 and over, adult: Secondary | ICD-10-CM | POA: Diagnosis not present

## 2023-07-15 DIAGNOSIS — G8929 Other chronic pain: Secondary | ICD-10-CM | POA: Diagnosis not present

## 2023-07-15 DIAGNOSIS — M2550 Pain in unspecified joint: Secondary | ICD-10-CM | POA: Diagnosis not present

## 2023-07-15 DIAGNOSIS — Z79899 Other long term (current) drug therapy: Secondary | ICD-10-CM | POA: Diagnosis not present

## 2023-08-12 DIAGNOSIS — M2550 Pain in unspecified joint: Secondary | ICD-10-CM | POA: Diagnosis not present

## 2023-08-12 DIAGNOSIS — R109 Unspecified abdominal pain: Secondary | ICD-10-CM | POA: Diagnosis not present

## 2023-08-12 DIAGNOSIS — G8929 Other chronic pain: Secondary | ICD-10-CM | POA: Diagnosis not present

## 2023-08-12 DIAGNOSIS — Z6841 Body Mass Index (BMI) 40.0 and over, adult: Secondary | ICD-10-CM | POA: Diagnosis not present

## 2023-08-12 DIAGNOSIS — M545 Low back pain, unspecified: Secondary | ICD-10-CM | POA: Diagnosis not present

## 2023-09-09 DIAGNOSIS — R109 Unspecified abdominal pain: Secondary | ICD-10-CM | POA: Diagnosis not present

## 2023-09-09 DIAGNOSIS — Z6841 Body Mass Index (BMI) 40.0 and over, adult: Secondary | ICD-10-CM | POA: Diagnosis not present

## 2023-09-09 DIAGNOSIS — M545 Low back pain, unspecified: Secondary | ICD-10-CM | POA: Diagnosis not present

## 2023-09-09 DIAGNOSIS — M2550 Pain in unspecified joint: Secondary | ICD-10-CM | POA: Diagnosis not present

## 2023-09-09 DIAGNOSIS — Z79899 Other long term (current) drug therapy: Secondary | ICD-10-CM | POA: Diagnosis not present

## 2023-09-27 DIAGNOSIS — K581 Irritable bowel syndrome with constipation: Secondary | ICD-10-CM | POA: Diagnosis not present

## 2023-09-27 DIAGNOSIS — R1084 Generalized abdominal pain: Secondary | ICD-10-CM | POA: Diagnosis not present

## 2023-09-27 DIAGNOSIS — R634 Abnormal weight loss: Secondary | ICD-10-CM | POA: Diagnosis not present

## 2023-10-07 DIAGNOSIS — Z6841 Body Mass Index (BMI) 40.0 and over, adult: Secondary | ICD-10-CM | POA: Diagnosis not present

## 2023-10-07 DIAGNOSIS — M545 Low back pain, unspecified: Secondary | ICD-10-CM | POA: Diagnosis not present

## 2023-10-07 DIAGNOSIS — Z79899 Other long term (current) drug therapy: Secondary | ICD-10-CM | POA: Diagnosis not present

## 2023-10-07 DIAGNOSIS — M2550 Pain in unspecified joint: Secondary | ICD-10-CM | POA: Diagnosis not present

## 2023-10-27 ENCOUNTER — Encounter: Payer: Medicaid Other | Admitting: Obstetrics and Gynecology

## 2023-11-04 DIAGNOSIS — Z6841 Body Mass Index (BMI) 40.0 and over, adult: Secondary | ICD-10-CM | POA: Diagnosis not present

## 2023-11-04 DIAGNOSIS — Z79899 Other long term (current) drug therapy: Secondary | ICD-10-CM | POA: Diagnosis not present

## 2023-11-04 DIAGNOSIS — M545 Low back pain, unspecified: Secondary | ICD-10-CM | POA: Diagnosis not present

## 2023-11-04 DIAGNOSIS — M25561 Pain in right knee: Secondary | ICD-10-CM | POA: Diagnosis not present

## 2023-11-04 DIAGNOSIS — M25562 Pain in left knee: Secondary | ICD-10-CM | POA: Diagnosis not present

## 2023-11-11 DIAGNOSIS — N898 Other specified noninflammatory disorders of vagina: Secondary | ICD-10-CM | POA: Diagnosis not present

## 2023-11-11 DIAGNOSIS — Z0000 Encounter for general adult medical examination without abnormal findings: Secondary | ICD-10-CM | POA: Diagnosis not present

## 2023-11-11 DIAGNOSIS — E559 Vitamin D deficiency, unspecified: Secondary | ICD-10-CM | POA: Diagnosis not present

## 2023-11-11 DIAGNOSIS — R5383 Other fatigue: Secondary | ICD-10-CM | POA: Diagnosis not present

## 2023-11-11 DIAGNOSIS — F17210 Nicotine dependence, cigarettes, uncomplicated: Secondary | ICD-10-CM | POA: Diagnosis not present

## 2023-11-11 DIAGNOSIS — Z7251 High risk heterosexual behavior: Secondary | ICD-10-CM | POA: Diagnosis not present

## 2023-11-22 DIAGNOSIS — K0889 Other specified disorders of teeth and supporting structures: Secondary | ICD-10-CM | POA: Diagnosis not present

## 2023-11-22 DIAGNOSIS — K029 Dental caries, unspecified: Secondary | ICD-10-CM | POA: Diagnosis not present

## 2023-11-22 DIAGNOSIS — K047 Periapical abscess without sinus: Secondary | ICD-10-CM | POA: Diagnosis not present

## 2023-11-25 ENCOUNTER — Emergency Department (HOSPITAL_COMMUNITY)
Admission: EM | Admit: 2023-11-25 | Discharge: 2023-11-25 | Disposition: A | Discharge status: Home / Self Care | Attending: Emergency Medicine | Admitting: Emergency Medicine

## 2023-11-25 ENCOUNTER — Other Ambulatory Visit: Payer: Self-pay

## 2023-11-25 ENCOUNTER — Encounter (HOSPITAL_COMMUNITY): Payer: Self-pay

## 2023-11-25 ENCOUNTER — Emergency Department (HOSPITAL_COMMUNITY)

## 2023-11-25 DIAGNOSIS — R221 Localized swelling, mass and lump, neck: Secondary | ICD-10-CM | POA: Diagnosis not present

## 2023-11-25 DIAGNOSIS — I10 Essential (primary) hypertension: Secondary | ICD-10-CM | POA: Diagnosis not present

## 2023-11-25 DIAGNOSIS — J029 Acute pharyngitis, unspecified: Secondary | ICD-10-CM | POA: Diagnosis not present

## 2023-11-25 DIAGNOSIS — M799 Soft tissue disorder, unspecified: Secondary | ICD-10-CM | POA: Diagnosis not present

## 2023-11-25 DIAGNOSIS — R609 Edema, unspecified: Secondary | ICD-10-CM | POA: Diagnosis not present

## 2023-11-25 DIAGNOSIS — Z79899 Other long term (current) drug therapy: Secondary | ICD-10-CM | POA: Insufficient documentation

## 2023-11-25 DIAGNOSIS — L0201 Cutaneous abscess of face: Secondary | ICD-10-CM | POA: Diagnosis not present

## 2023-11-25 DIAGNOSIS — R0689 Other abnormalities of breathing: Secondary | ICD-10-CM | POA: Diagnosis not present

## 2023-11-25 LAB — BASIC METABOLIC PANEL WITH GFR
Anion gap: 11 (ref 5–15)
BUN: 7 mg/dL (ref 6–20)
CO2: 24 mmol/L (ref 22–32)
Calcium: 9.4 mg/dL (ref 8.9–10.3)
Chloride: 100 mmol/L (ref 98–111)
Creatinine, Ser: 0.76 mg/dL (ref 0.44–1.00)
GFR, Estimated: >60 mL/min (ref >60)
Glucose, Bld: 93 mg/dL (ref 70–99)
Potassium: 4 mmol/L (ref 3.5–5.1)
Sodium: 135 mmol/L (ref 135–145)

## 2023-11-25 LAB — CBC
HCT: 39.8 % (ref 36.0–46.0)
Hemoglobin: 12.5 g/dL (ref 12.0–15.0)
MCH: 27.1 pg (ref 26.0–34.0)
MCHC: 31.4 g/dL (ref 30.0–36.0)
MCV: 86.3 fL (ref 80.0–100.0)
Platelets: 249 10*3/uL (ref 150–400)
RBC: 4.61 MIL/uL (ref 3.87–5.11)
RDW: 15.3 % (ref 11.5–15.5)
WBC: 7.9 10*3/uL (ref 4.0–10.5)
nRBC: 0 % (ref 0.0–0.2)

## 2023-11-25 MED ORDER — SODIUM CHLORIDE 0.9 % IV BOLUS
1000.0000 mL | Freq: Once | INTRAVENOUS | Status: AC
  Administered 2023-11-25: 1000 mL via INTRAVENOUS

## 2023-11-25 MED ORDER — PREDNISONE 20 MG PO TABS: 40.0000 mg | ORAL_TABLET | Freq: Every day | ORAL | 0 refills | Status: AC

## 2023-11-25 MED ORDER — AMOXICILLIN-POT CLAVULANATE 875-125 MG PO TABS: 1.0000 | ORAL_TABLET | Freq: Two times a day (BID) | ORAL | 0 refills | Status: AC

## 2023-11-25 MED ORDER — IOHEXOL 350 MG/ML SOLN
75.0000 mL | Freq: Once | INTRAVENOUS | Status: AC | PRN
  Administered 2023-11-25: 75 mL via INTRAVENOUS

## 2023-11-25 MED ORDER — METHYLPREDNISOLONE SODIUM SUCC 125 MG IJ SOLR
125.0000 mg | Freq: Once | INTRAMUSCULAR | Status: AC
  Administered 2023-11-25: 125 mg via INTRAVENOUS
  Filled 2023-11-25: qty 2

## 2023-11-25 MED ORDER — KETOROLAC TROMETHAMINE 15 MG/ML IJ SOLN
15.0000 mg | Freq: Once | INTRAMUSCULAR | Status: AC
Start: 2023-11-25 — End: 2023-11-25
  Administered 2023-11-25: 15 mg via INTRAVENOUS
  Filled 2023-11-25: qty 1

## 2023-11-25 NOTE — ED Provider Notes (Signed)
 [place] [facility] AT [facility] Provider Note   CSN: [id number] Arrival date & time: 11/25/23  1022     History  Chief Complaint  Patient presents with   Abscess    Shari Smith is a 53 y.o. female with past medical history significant for hypertension, anxiety, thyroid disease, GERD who presents with concern for dental infection which turned into an abscess of the neck, chin according to patient.  She was seen at an urgent care a few days ago and started on clindamycin but reports the swelling is getting worse and going into the front part of her neck.  She reports difficulty speaking, swallowing.  She denies any nausea, vomiting.  She rates the pain 10/10.   Abscess      Home Medications Prior to Admission medications   Medication Sig Start Date End Date Taking? Authorizing Provider  amoxicillin-clavulanate (AUGMENTIN) 875-125 MG tablet Take 1 tablet by mouth every 12 (twelve) hours. 11/25/23  Yes Latifah Padin H, PA-C  predniSONE (DELTASONE) 20 MG tablet Take 2 tablets (40 mg total) by mouth daily. 11/25/23  Yes Tywan Siever H, PA-C  atenolol (TENORMIN) 50 MG tablet Take 50 mg by mouth daily. 07/15/20   [provider]  bumetanide (BUMEX) 0.5 MG tablet Take 0.5 mg by mouth daily. 07/15/20   [provider]  diclofenac (VOLTAREN) 75 MG EC tablet Take 1 tablet (75 mg total) by mouth 2 (two) times daily. 12/07/22   Tarry Kos, MD  ibuprofen (ADVIL) 800 MG tablet TAKE 1 TABLET(800 MG) BY MOUTH EVERY 8 HOURS 08/22/20   Brock Bad, MD  linaclotide [facility]) 72 MCG capsule Take 72 mcg by mouth daily before breakfast.    [provider]  oxyCODONE-acetaminophen (PERCOCET/ROXICET) 5-325 MG tablet Take 1 tablet by mouth every 4 (four) hours as needed for moderate pain. 07/23/20   Hermina Staggers, MD  pantoprazole (PROTONIX) 20 MG tablet Take 1 tablet (20 mg total) by mouth daily. 07/09/21   Merrilee Jansky, MD   traZODone (DESYREL) 150 MG tablet Take 150 mg by mouth at bedtime. 07/15/20   [provider]      Allergies    Lisinopril and Strawberry extract    Review of Systems   Review of Systems  All other systems reviewed and are negative.   Physical Exam Updated Vital Signs BP (!) 160/96   Pulse 62   Temp 98.6 F (37 C) (Oral)   Resp 18   Ht 5\' 6"  (1.676 m)   Wt 131.1 kg   LMP 07/08/2020   SpO2 100%   BMI 46.65 kg/m  Physical Exam Vitals and nursing note reviewed.  Constitutional:      General: She is not in acute distress.    Appearance: Normal appearance.  HENT:     Head: Normocephalic and atraumatic.     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  Her anterior neck is swollen especially on the left with some tenderness to palpation, no induration.  She has trismus and some dysphonia.  She does not have any floor of mouth swelling and she is tolerating swallowing without difficulty.  Trachea is midline. Eyes:     General:        Right eye: No discharge.        Left eye: No discharge.  Cardiovascular:     Rate and Rhythm: Normal  rate and regular rhythm.     Heart sounds: No murmur heard.    No friction rub. No gallop.  Pulmonary:     Effort: Pulmonary effort is normal.     Breath sounds: Normal breath sounds.  Abdominal:     General: Bowel sounds are normal.     Palpations: Abdomen is soft.  Skin:    General: Skin is warm and dry.     Capillary Refill: Capillary refill takes less than 2 seconds.  Neurological:     Mental Status: She is alert and oriented to person, place, and time.  Psychiatric:        Mood and Affect: Mood normal.        Behavior: Behavior normal.     ED Results / Procedures / Treatments   Labs (all labs ordered are listed, but only abnormal results are displayed) Labs Reviewed  CBC  BASIC METABOLIC PANEL WITH GFR     EKG None  Radiology CT Soft Tissue Neck W Contrast Result Date: 11/25/2023 CLINICAL DATA:  Provided history: Epiglottitis or tonsillitis suspected. Additional history provided: Abscess under chin, "shifting" to throat with difficulty breathing/swallowing. EXAM: CT NECK WITH CONTRAST TECHNIQUE: Multidetector CT imaging of the neck was performed using the standard protocol following the bolus administration of intravenous contrast. RADIATION DOSE REDUCTION: This exam was performed according to the departmental dose-optimization program which includes automated exposure control, adjustment of the mA and/or kV according to patient size and/or use of iterative reconstruction technique. CONTRAST:  75mL OMNIPAQUE IOHEXOL 350 MG/ML SOLN COMPARISON:  None. FINDINGS: Pharynx and larynx: Mild soft tissue prominence/edema at the level of the hypopharynx (for instance as seen on series 3, image 64) (series 5, image 64). No appreciable swelling or mass elsewhere in the oral cavity, pharynx or larynx. No significant airway effacement. Salivary glands: No inflammation, mass, or stone. Thyroid: Unremarkable. Lymph nodes: No pathologically enlarged lymph nodes identified within the neck. Vascular: The major vascular structures of the neck are patent. Partially retropharyngeal course of the cervical internal carotid arteries. Limited intracranial: No evidence of an acute intracranial abnormality within the field of view. Visualized orbits: Incompletely imaged. No orbital mass or acute orbital finding at the imaged levels. Mastoids and visualized paranasal sinuses: Portions of the frontal and ethmoid sinuses are excluded from the field of view superiorly. No significant paranasal sinus disease at the imaged levels. Trace fluid within left mastoid air cells. Skeleton: Poor dentition with multiple absent and carious teeth. Periapical lucency surrounding the left maxillary first molar tooth consistent with periodontal disease.  No acute fracture or aggressive osseous lesion elsewhere. Upper chest: No consolidation within the imaged lung apices. Other: Soft tissue stranding along the mandibular body at midline and to the left. No soft tissue abscess is identified. IMPRESSION: 1. Mild soft tissue prominence/edema at the level of the hypopharynx. This may reflect acute pharyngitis. However, a hypopharyngeal mass cannot be excluded and if symptoms persist endoscopy should be considered. 2. Soft tissue stranding along the mandibular body at midline and to the left suggesting cellulitis/soft tissue infection. No soft tissue abscess. 3. Periapical lucency surrounding left maxillary first molar tooth consistent with periodontal disease. Background poor dentition. 4. Trace fluid within left mastoid air cells. Electronically Signed   By: Jackey Loge D.O.   On: 11/25/2023 14:26    Procedures Procedures    Medications Ordered in ED Medications  sodium chloride 0.9 % bolus 1,000 mL (0 mLs Intravenous Stopped 11/25/23 1357)  ketorolac (TORADOL) 15 MG/ML  injection 15 mg (15 mg Intravenous Given 11/25/23 1139)  methylPREDNISolone sodium succinate (SOLU-MEDROL) 125 mg/2 mL injection 125 mg (125 mg Intravenous Given 11/25/23 1139)  iohexol (OMNIPAQUE) 350 MG/ML injection 75 mL (75 mLs Intravenous Contrast Given 11/25/23 1330)    ED Course/ Medical Decision Making/ A&P                                 Medical Decision Making  This patient is a 53 y.o. female  who presents to the ED for concern of possible neck infection / dental infection.   Differential diagnoses prior to evaluation: The emergent differential diagnosis includes, but is not limited to,  epiglottitis, tonsillitis, RPA, PTA, ludwig angina, or other odontogenic infection vs toerh . This is not an exhaustive differential.   Past Medical History / Co-morbidities / Social History: Obesity, thyroid disease, gerd  Additional history: Chart reviewed. 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  Her anterior neck is swollen especially on the left with some induration.  She has trismus and some dysphonia.  Physical Exam: Physical exam performed. 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  Her anterior neck is swollen especially on the left with some tenderness to palpation, no induration.  She has trismus and some dysphonia.  She does not have any floor of mouth swelling and she is tolerating swallowing without difficulty.  Trachea is midline.  Some hypertension, blood pressure 160/96  Lab Tests/Imaging studies: I personally interpreted labs/imaging and the pertinent results include: CBC, BMP unremarkable, I independently interpreted CT soft tissue of the neck which shows:  1. Mild soft tissue prominence/edema at the level of the  hypopharynx. This may reflect acute pharyngitis. However, a  hypopharyngeal mass cannot be excluded and if symptoms persist  endoscopy should be considered.  2. Soft tissue stranding along the mandibular body at midline and to  the left suggesting cellulitis/soft tissue infection. No soft tissue  abscess.  3. Periapical lucency surrounding left maxillary first molar tooth  consistent with periodontal disease. Background poor dentition.  4. Trace fluid within left mastoid air cells.   I agree with the radiologist interpretation.   Medications: I ordered medication including fluid bolus, Toradol, Solu-Medrol.  I have reviewed the patients home medicines and have made adjustments as needed.   Disposition: After consideration of the diagnostic results and the patients response to treatment, I feel that patient with odontogenic infection, soft tissue swelling of neck, pharyngitis, given no improvement on clindamycin I had a  discussion with patient about presented treatment with outpatient antibiotics, steroids, close ENT follow-up, with plan for urgent ED return if symptoms worsen, no evidence of current Ludwig angina, retropharyngeal abscess, patient tolerating food and fluids at time of discharge..   emergency department workup does not suggest an emergent condition requiring admission or immediate intervention beyond what has been performed at this time. The plan is: as above. The patient is safe for discharge and has been instructed to return immediately for worsening symptoms, change in symptoms or any other concerns.  Final Clinical Impression(s) / ED Diagnoses Final diagnoses:  Pharyngitis, unspecified etiology  Neck swelling    Rx / DC Orders ED Discharge Orders          Ordered    amoxicillin-clavulanate (AUGMENTIN) 875-125  MG tablet  Every 12 hours        11/25/23 1457    predniSONE (DELTASONE) 20 MG tablet  Daily        11/25/23 1457              Ryver Poblete, [place] H, PA-C 11/25/23 1525    Pricilla Loveless, MD 11/26/23 [phone]

## 2023-11-25 NOTE — Discharge Instructions (Addendum)
 Please use Tylenol or ibuprofen for pain.  You may use 600 mg ibuprofen every 6 hours or 1000 mg of Tylenol every 6 hours.  You may choose to alternate between the 2.  This would be most effective.  Not to exceed 4 g of Tylenol within 24 hours.  Not to exceed 3200 mg ibuprofen 24 hours.  Please take the entire course of antibiotics that we have prescribed in place of the other ones that you were prescribed, as well as take the steroids to help with swelling.  If you are not noticing significant improvement after around 48 to 72 hours please return for further evaluation, if you begin to have difficulty breathing, swallowing, or symptoms are otherwise worsening despite treatment please return to the emergency department for further evaluation.

## 2023-11-25 NOTE — ED Notes (Signed)
 Pt has 2+ swelling under chin and 1+ swelling under left jaw bone. Pt is able to maintain oral secretions. Pt is eupneic.

## 2023-11-25 NOTE — ED Triage Notes (Signed)
 Pt here for abscess under chin, pt states it is shifting  to throat and difficulty breathing/ swallowing. Pt was seen Saturday for same.  Pt states taking abx since Tuesday. Axox4, able to talk.

## 2023-12-02 DIAGNOSIS — M25562 Pain in left knee: Secondary | ICD-10-CM | POA: Diagnosis not present

## 2023-12-02 DIAGNOSIS — M545 Low back pain, unspecified: Secondary | ICD-10-CM | POA: Diagnosis not present

## 2023-12-02 DIAGNOSIS — Z6841 Body Mass Index (BMI) 40.0 and over, adult: Secondary | ICD-10-CM | POA: Diagnosis not present

## 2023-12-02 DIAGNOSIS — M25561 Pain in right knee: Secondary | ICD-10-CM | POA: Diagnosis not present

## 2023-12-02 DIAGNOSIS — Z79899 Other long term (current) drug therapy: Secondary | ICD-10-CM | POA: Diagnosis not present

## 2023-12-30 DIAGNOSIS — M25562 Pain in left knee: Secondary | ICD-10-CM | POA: Diagnosis not present

## 2023-12-30 DIAGNOSIS — M25461 Effusion, right knee: Secondary | ICD-10-CM | POA: Diagnosis not present

## 2023-12-30 DIAGNOSIS — M545 Low back pain, unspecified: Secondary | ICD-10-CM | POA: Diagnosis not present

## 2023-12-30 DIAGNOSIS — R1013 Epigastric pain: Secondary | ICD-10-CM | POA: Diagnosis not present

## 2023-12-30 DIAGNOSIS — M25561 Pain in right knee: Secondary | ICD-10-CM | POA: Diagnosis not present

## 2023-12-30 DIAGNOSIS — Z6841 Body Mass Index (BMI) 40.0 and over, adult: Secondary | ICD-10-CM | POA: Diagnosis not present

## 2023-12-30 DIAGNOSIS — Z79899 Other long term (current) drug therapy: Secondary | ICD-10-CM | POA: Diagnosis not present

## 2024-01-23 DIAGNOSIS — K5909 Other constipation: Secondary | ICD-10-CM | POA: Diagnosis not present

## 2024-01-23 DIAGNOSIS — Z1231 Encounter for screening mammogram for malignant neoplasm of breast: Secondary | ICD-10-CM | POA: Diagnosis not present

## 2024-01-23 DIAGNOSIS — R7302 Impaired glucose tolerance (oral): Secondary | ICD-10-CM | POA: Diagnosis not present

## 2024-01-23 DIAGNOSIS — F17210 Nicotine dependence, cigarettes, uncomplicated: Secondary | ICD-10-CM | POA: Diagnosis not present

## 2024-01-23 DIAGNOSIS — F32A Depression, unspecified: Secondary | ICD-10-CM | POA: Diagnosis not present

## 2024-01-23 DIAGNOSIS — R030 Elevated blood-pressure reading, without diagnosis of hypertension: Secondary | ICD-10-CM | POA: Diagnosis not present

## 2024-01-23 DIAGNOSIS — K219 Gastro-esophageal reflux disease without esophagitis: Secondary | ICD-10-CM | POA: Diagnosis not present

## 2024-01-23 DIAGNOSIS — R5383 Other fatigue: Secondary | ICD-10-CM | POA: Diagnosis not present

## 2024-01-23 DIAGNOSIS — Z6841 Body Mass Index (BMI) 40.0 and over, adult: Secondary | ICD-10-CM | POA: Diagnosis not present

## 2024-01-23 DIAGNOSIS — R1084 Generalized abdominal pain: Secondary | ICD-10-CM | POA: Diagnosis not present

## 2024-01-23 DIAGNOSIS — G4700 Insomnia, unspecified: Secondary | ICD-10-CM | POA: Diagnosis not present

## 2024-01-25 DIAGNOSIS — R634 Abnormal weight loss: Secondary | ICD-10-CM | POA: Diagnosis not present

## 2024-01-27 DIAGNOSIS — M545 Low back pain, unspecified: Secondary | ICD-10-CM | POA: Diagnosis not present

## 2024-01-27 DIAGNOSIS — Z79899 Other long term (current) drug therapy: Secondary | ICD-10-CM | POA: Diagnosis not present

## 2024-01-27 DIAGNOSIS — M25562 Pain in left knee: Secondary | ICD-10-CM | POA: Diagnosis not present

## 2024-01-27 DIAGNOSIS — Z6841 Body Mass Index (BMI) 40.0 and over, adult: Secondary | ICD-10-CM | POA: Diagnosis not present

## 2024-01-27 DIAGNOSIS — M25561 Pain in right knee: Secondary | ICD-10-CM | POA: Diagnosis not present

## 2024-02-01 DIAGNOSIS — Z79899 Other long term (current) drug therapy: Secondary | ICD-10-CM | POA: Diagnosis not present

## 2024-03-23 DIAGNOSIS — Z79899 Other long term (current) drug therapy: Secondary | ICD-10-CM | POA: Diagnosis not present

## 2024-03-23 DIAGNOSIS — Z6841 Body Mass Index (BMI) 40.0 and over, adult: Secondary | ICD-10-CM | POA: Diagnosis not present

## 2024-03-23 DIAGNOSIS — G8929 Other chronic pain: Secondary | ICD-10-CM | POA: Diagnosis not present

## 2024-04-17 DIAGNOSIS — Z6841 Body Mass Index (BMI) 40.0 and over, adult: Secondary | ICD-10-CM | POA: Diagnosis not present

## 2024-04-17 DIAGNOSIS — R030 Elevated blood-pressure reading, without diagnosis of hypertension: Secondary | ICD-10-CM | POA: Diagnosis not present

## 2024-04-17 DIAGNOSIS — K219 Gastro-esophageal reflux disease without esophagitis: Secondary | ICD-10-CM | POA: Diagnosis not present

## 2024-04-17 DIAGNOSIS — I10 Essential (primary) hypertension: Secondary | ICD-10-CM | POA: Diagnosis not present

## 2024-04-17 DIAGNOSIS — F32A Depression, unspecified: Secondary | ICD-10-CM | POA: Diagnosis not present

## 2024-04-17 DIAGNOSIS — G4700 Insomnia, unspecified: Secondary | ICD-10-CM | POA: Diagnosis not present

## 2024-04-17 DIAGNOSIS — F17210 Nicotine dependence, cigarettes, uncomplicated: Secondary | ICD-10-CM | POA: Diagnosis not present

## 2024-04-17 DIAGNOSIS — K5909 Other constipation: Secondary | ICD-10-CM | POA: Diagnosis not present

## 2024-04-17 DIAGNOSIS — R1084 Generalized abdominal pain: Secondary | ICD-10-CM | POA: Diagnosis not present

## 2024-04-20 DIAGNOSIS — M545 Low back pain, unspecified: Secondary | ICD-10-CM | POA: Diagnosis not present

## 2024-04-20 DIAGNOSIS — Z79899 Other long term (current) drug therapy: Secondary | ICD-10-CM | POA: Diagnosis not present

## 2024-04-20 DIAGNOSIS — M25561 Pain in right knee: Secondary | ICD-10-CM | POA: Diagnosis not present

## 2024-04-20 DIAGNOSIS — M25562 Pain in left knee: Secondary | ICD-10-CM | POA: Diagnosis not present

## 2024-04-20 DIAGNOSIS — Z6841 Body Mass Index (BMI) 40.0 and over, adult: Secondary | ICD-10-CM | POA: Diagnosis not present

## 2024-04-26 DIAGNOSIS — R1084 Generalized abdominal pain: Secondary | ICD-10-CM | POA: Diagnosis not present

## 2024-04-26 DIAGNOSIS — D7389 Other diseases of spleen: Secondary | ICD-10-CM | POA: Diagnosis not present

## 2024-04-27 ENCOUNTER — Other Ambulatory Visit: Payer: Self-pay

## 2024-04-27 DIAGNOSIS — Z1231 Encounter for screening mammogram for malignant neoplasm of breast: Secondary | ICD-10-CM

## 2024-05-07 DIAGNOSIS — D7389 Other diseases of spleen: Secondary | ICD-10-CM | POA: Diagnosis not present

## 2024-05-18 DIAGNOSIS — Z6841 Body Mass Index (BMI) 40.0 and over, adult: Secondary | ICD-10-CM | POA: Diagnosis not present

## 2024-05-18 DIAGNOSIS — M25561 Pain in right knee: Secondary | ICD-10-CM | POA: Diagnosis not present

## 2024-05-18 DIAGNOSIS — Z79899 Other long term (current) drug therapy: Secondary | ICD-10-CM | POA: Diagnosis not present

## 2024-05-18 DIAGNOSIS — M545 Low back pain, unspecified: Secondary | ICD-10-CM | POA: Diagnosis not present

## 2024-05-18 DIAGNOSIS — M25562 Pain in left knee: Secondary | ICD-10-CM | POA: Diagnosis not present

## 2024-05-22 DIAGNOSIS — R1084 Generalized abdominal pain: Secondary | ICD-10-CM | POA: Diagnosis not present

## 2024-05-22 DIAGNOSIS — Z9889 Other specified postprocedural states: Secondary | ICD-10-CM | POA: Diagnosis not present

## 2024-05-22 DIAGNOSIS — I10 Essential (primary) hypertension: Secondary | ICD-10-CM | POA: Diagnosis not present

## 2024-05-22 DIAGNOSIS — Z6841 Body Mass Index (BMI) 40.0 and over, adult: Secondary | ICD-10-CM | POA: Diagnosis not present

## 2024-05-22 DIAGNOSIS — Z8719 Personal history of other diseases of the digestive system: Secondary | ICD-10-CM | POA: Diagnosis not present

## 2024-05-22 DIAGNOSIS — R030 Elevated blood-pressure reading, without diagnosis of hypertension: Secondary | ICD-10-CM | POA: Diagnosis not present

## 2024-05-28 ENCOUNTER — Ambulatory Visit: Admitting: Plastic Surgery

## 2024-05-28 DIAGNOSIS — E65 Localized adiposity: Secondary | ICD-10-CM | POA: Diagnosis not present

## 2024-05-28 DIAGNOSIS — G8929 Other chronic pain: Secondary | ICD-10-CM

## 2024-05-28 DIAGNOSIS — M549 Dorsalgia, unspecified: Secondary | ICD-10-CM | POA: Insufficient documentation

## 2024-05-28 DIAGNOSIS — M793 Panniculitis, unspecified: Secondary | ICD-10-CM | POA: Insufficient documentation

## 2024-05-28 NOTE — Progress Notes (Unsigned)
 Patient ID: Shari Smith, female    DOB: 15-Nov-1970, 53 y.o.   MRN: [id number]   Chief Complaint  Patient presents with   Advice Only    The patient is a 53 year old female here for evaluation of her abdomen.  She is 5 feet 7 inches tall and 296 pounds.  She was down to 200 pounds with some medication but when it got stopped she gained the weight back.  She is hoping to decrease her weight again.  She is open to [facility] the [facility].  She has had cholecystectomy and a hernia repair in the past.     Review of Systems  Constitutional: Negative.   Eyes: Negative.   Respiratory: Negative.    Cardiovascular: Negative.   Gastrointestinal: Negative.   Endocrine: Negative.   Genitourinary: Negative.   Musculoskeletal:  Positive for back pain and neck pain.  Skin:  Positive for rash.    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   Anemia    Anxiety    Arthritis    Fibroids    Focal nodular hyperplasia of liver    GERD (gastroesophageal reflux disease)    Headache    migraines   Hypertension    Thyroid  disease     Past Surgical History:  Procedure Laterality Date   DILATION AND CURETTAGE OF UTERUS     HERNIA REPAIR     ventral hernia repair   HYSTEROSCOPY WITH D & C N/A 06/22/2018   Procedure: DILATATION AND CURETTAGE /HYSTEROSCOPY PAP SMEAR OF CERIX ENDO CERVICAL CURETTAGE;  Surgeon: Anitra Freddy NOVAK, MD;  Location: [facility];  Service: Gynecology;  Laterality: N/A;   LAPAROSCOPIC CHOLECYSTECTOMY  2010   TUBAL LIGATION     VAGINAL HYSTERECTOMY Bilateral 07/22/2020   Procedure: TOTAL VAGINAL HYSTERECTOMY WITH BILATERAL SALPINGECTOMIES;  Surgeon: Lorence Ozell CROME, MD;  Location: [facility] OR;  Service: Gynecology;  Laterality: Bilateral;   VENTRAL HERNIA REPAIR  2017   with mesh per patient      Current Outpatient Medications:    amoxicillin -clavulanate (AUGMENTIN ) 875-125 MG  tablet, Take 1 tablet by mouth every 12 (twelve) hours., Disp: 14 tablet, Rfl: 0   atenolol  (TENORMIN ) 50 MG tablet, Take 50 mg by mouth daily., Disp: , Rfl:    bumetanide  (BUMEX ) 0.5 MG tablet, Take 0.5 mg by mouth daily., Disp: , Rfl:    diclofenac  (VOLTAREN ) 75 MG EC tablet, Take 1 tablet (75 mg total) by mouth 2 (two) times daily., Disp: 30 tablet, Rfl: 2   ibuprofen  (ADVIL ) 800 MG tablet, TAKE 1 TABLET(800 MG) BY MOUTH EVERY 8 HOURS, Disp: 30 tablet, Rfl: 5   linaclotide (LINZESS) 72 MCG capsule, Take 72 mcg by mouth daily before breakfast., Disp: , Rfl:    oxyCODONE -acetaminophen  (PERCOCET/ROXICET) 5-325 MG tablet, Take 1 tablet by mouth every 4 (four) hours as needed for moderate pain., Disp: 30 tablet, Rfl: 0   pantoprazole  (PROTONIX ) 20 MG tablet, Take 1 tablet (20 mg total) by mouth daily., Disp: 30 tablet, Rfl: 1   predniSONE  (DELTASONE ) 20 MG tablet, Take 2 tablets (40 mg total) by mouth daily., Disp: 10 tablet, Rfl: 0   traZODone  (DESYREL ) 150 MG tablet, Take 150 mg by mouth at bedtime., Disp: , Rfl:    Objective:   Vitals:   05/28/24 1441 05/28/24 1446  BP: (!) 163/97 (!) 154/95  Pulse: 68   SpO2: 97%  Physical Exam Vitals reviewed.  Constitutional:      Appearance: Normal appearance.  HENT:     Head: Atraumatic.  Cardiovascular:     Rate and Rhythm: Normal rate.     Pulses: Normal pulses.  Pulmonary:     Effort: Pulmonary effort is normal.  Abdominal:     General: There is no distension.     Palpations: Abdomen is soft. There is no mass.     Tenderness: There is no abdominal tenderness.  Skin:    General: Skin is warm.     Capillary Refill: Capillary refill takes less than 2 seconds.  Neurological:     Mental Status: She is alert and oriented to person, place, and time.  Psychiatric:        Mood and Affect: Mood normal.        Behavior: Behavior normal.        Thought Content: Thought content normal.        Judgment: Judgment normal.     Assessment &  Plan:  Obesity, morbid (HCC) - Plan: Amb Ref to Medical Weight Management  ***  Pictures were obtained of the patient and placed in the chart with the patient's or guardian's permission.  Shari RAMAN Mellonie Guess, DO

## 2024-06-15 DIAGNOSIS — Z79899 Other long term (current) drug therapy: Secondary | ICD-10-CM | POA: Diagnosis not present

## 2024-06-15 DIAGNOSIS — Z6841 Body Mass Index (BMI) 40.0 and over, adult: Secondary | ICD-10-CM | POA: Diagnosis not present

## 2024-06-15 DIAGNOSIS — R1084 Generalized abdominal pain: Secondary | ICD-10-CM | POA: Diagnosis not present

## 2024-06-15 DIAGNOSIS — M25562 Pain in left knee: Secondary | ICD-10-CM | POA: Diagnosis not present

## 2024-06-15 DIAGNOSIS — M545 Low back pain, unspecified: Secondary | ICD-10-CM | POA: Diagnosis not present

## 2024-06-15 DIAGNOSIS — M25561 Pain in right knee: Secondary | ICD-10-CM | POA: Diagnosis not present

## 2024-07-13 DIAGNOSIS — Z6841 Body Mass Index (BMI) 40.0 and over, adult: Secondary | ICD-10-CM | POA: Diagnosis not present

## 2024-07-13 DIAGNOSIS — M25561 Pain in right knee: Secondary | ICD-10-CM | POA: Diagnosis not present

## 2024-07-13 DIAGNOSIS — R109 Unspecified abdominal pain: Secondary | ICD-10-CM | POA: Diagnosis not present

## 2024-07-13 DIAGNOSIS — M25562 Pain in left knee: Secondary | ICD-10-CM | POA: Diagnosis not present

## 2024-07-13 DIAGNOSIS — Z79899 Other long term (current) drug therapy: Secondary | ICD-10-CM | POA: Diagnosis not present

## 2024-07-13 DIAGNOSIS — M545 Low back pain, unspecified: Secondary | ICD-10-CM | POA: Diagnosis not present

## 2024-07-17 ENCOUNTER — Other Ambulatory Visit: Payer: Self-pay

## 2024-07-17 DIAGNOSIS — R600 Localized edema: Secondary | ICD-10-CM | POA: Diagnosis not present

## 2024-07-17 DIAGNOSIS — R030 Elevated blood-pressure reading, without diagnosis of hypertension: Secondary | ICD-10-CM | POA: Diagnosis not present

## 2024-07-17 DIAGNOSIS — R1084 Generalized abdominal pain: Secondary | ICD-10-CM | POA: Diagnosis not present

## 2024-07-17 DIAGNOSIS — G4700 Insomnia, unspecified: Secondary | ICD-10-CM | POA: Diagnosis not present

## 2024-07-17 DIAGNOSIS — R5383 Other fatigue: Secondary | ICD-10-CM | POA: Diagnosis not present

## 2024-07-17 DIAGNOSIS — F32A Depression, unspecified: Secondary | ICD-10-CM | POA: Diagnosis not present

## 2024-07-17 DIAGNOSIS — K219 Gastro-esophageal reflux disease without esophagitis: Secondary | ICD-10-CM | POA: Diagnosis not present

## 2024-07-17 DIAGNOSIS — R7302 Impaired glucose tolerance (oral): Secondary | ICD-10-CM | POA: Diagnosis not present

## 2024-07-17 DIAGNOSIS — Z6841 Body Mass Index (BMI) 40.0 and over, adult: Secondary | ICD-10-CM | POA: Diagnosis not present

## 2024-07-17 DIAGNOSIS — Z1231 Encounter for screening mammogram for malignant neoplasm of breast: Secondary | ICD-10-CM

## 2024-07-17 DIAGNOSIS — I10 Essential (primary) hypertension: Secondary | ICD-10-CM | POA: Diagnosis not present

## 2024-07-17 DIAGNOSIS — F17210 Nicotine dependence, cigarettes, uncomplicated: Secondary | ICD-10-CM | POA: Diagnosis not present

## 2024-08-10 DIAGNOSIS — R109 Unspecified abdominal pain: Secondary | ICD-10-CM | POA: Diagnosis not present

## 2024-08-10 DIAGNOSIS — Z79899 Other long term (current) drug therapy: Secondary | ICD-10-CM | POA: Diagnosis not present

## 2024-08-10 DIAGNOSIS — Z6841 Body Mass Index (BMI) 40.0 and over, adult: Secondary | ICD-10-CM | POA: Diagnosis not present

## 2024-08-10 DIAGNOSIS — M2550 Pain in unspecified joint: Secondary | ICD-10-CM | POA: Diagnosis not present

## 2024-08-17 ENCOUNTER — Ambulatory Visit

## 2024-09-04 ENCOUNTER — Other Ambulatory Visit: Payer: Self-pay | Admitting: General Surgery

## 2024-09-04 DIAGNOSIS — R1084 Generalized abdominal pain: Secondary | ICD-10-CM

## 2024-09-07 ENCOUNTER — Ambulatory Visit
Admission: RE | Admit: 2024-09-07 | Discharge: 2024-09-07 | Disposition: A | Discharge status: Home / Self Care | Source: Ambulatory Visit | Attending: General Surgery | Admitting: General Surgery

## 2024-09-07 DIAGNOSIS — R1084 Generalized abdominal pain: Secondary | ICD-10-CM

## 2024-09-07 MED ORDER — IOPAMIDOL (ISOVUE-300) INJECTION 61%: 100.0000 mL | Freq: Once | INTRAVENOUS | Status: DC | PRN

## 2024-09-07 MED ORDER — IOHEXOL 300 MG/ML  SOLN
100.0000 mL | Freq: Once | INTRAMUSCULAR | Status: AC | PRN
  Administered 2024-09-07: 100 mL via INTRAVENOUS

## 2024-09-13 ENCOUNTER — Ambulatory Visit: Payer: Self-pay | Admitting: General Surgery
